# Patient Record
Sex: Female | Born: 1960 | Hispanic: No | Smoking: Heavy tobacco smoker
Health system: Southern US, Community
[De-identification: ages and names within clinical notes are randomized; demographics above are authoritative.]

## PROBLEM LIST (undated history)

## (undated) DIAGNOSIS — Z972 Presence of dental prosthetic device (complete) (partial): Secondary | ICD-10-CM

## (undated) DIAGNOSIS — J449 Chronic obstructive pulmonary disease, unspecified: Secondary | ICD-10-CM

## (undated) DIAGNOSIS — F329 Major depressive disorder, single episode, unspecified: Secondary | ICD-10-CM

## (undated) DIAGNOSIS — F32A Depression, unspecified: Secondary | ICD-10-CM

## (undated) DIAGNOSIS — H9190 Unspecified hearing loss, unspecified ear: Secondary | ICD-10-CM

## (undated) DIAGNOSIS — D649 Anemia, unspecified: Secondary | ICD-10-CM

## (undated) DIAGNOSIS — E039 Hypothyroidism, unspecified: Secondary | ICD-10-CM

## (undated) DIAGNOSIS — E079 Disorder of thyroid, unspecified: Secondary | ICD-10-CM

## (undated) HISTORY — PX: TONSILLECTOMY: SUR1361

## (undated) HISTORY — PX: TUBAL LIGATION: SHX77

---

## 1998-01-05 ENCOUNTER — Emergency Department (HOSPITAL_COMMUNITY): Admission: EM | Admit: 1998-01-05 | Discharge: 1998-01-05 | Payer: Self-pay | Admitting: Emergency Medicine

## 1998-05-10 ENCOUNTER — Emergency Department (HOSPITAL_COMMUNITY): Admission: EM | Admit: 1998-05-10 | Discharge: 1998-05-10 | Payer: Self-pay | Admitting: Emergency Medicine

## 1998-06-01 ENCOUNTER — Emergency Department (HOSPITAL_COMMUNITY): Admission: EM | Admit: 1998-06-01 | Discharge: 1998-06-01 | Payer: Self-pay | Admitting: Emergency Medicine

## 1998-06-19 ENCOUNTER — Emergency Department (HOSPITAL_COMMUNITY): Admission: EM | Admit: 1998-06-19 | Discharge: 1998-06-19 | Payer: Self-pay

## 1998-06-22 ENCOUNTER — Encounter: Admission: RE | Admit: 1998-06-22 | Discharge: 1998-06-22 | Payer: Self-pay | Admitting: Hematology and Oncology

## 1998-11-01 ENCOUNTER — Encounter: Admission: RE | Admit: 1998-11-01 | Discharge: 1998-11-01 | Payer: Self-pay | Admitting: Internal Medicine

## 1998-12-13 ENCOUNTER — Encounter: Admission: RE | Admit: 1998-12-13 | Discharge: 1998-12-13 | Payer: Self-pay | Admitting: Internal Medicine

## 1999-01-06 ENCOUNTER — Encounter: Admission: RE | Admit: 1999-01-06 | Discharge: 1999-01-06 | Payer: Self-pay | Admitting: Internal Medicine

## 1999-02-20 ENCOUNTER — Encounter: Admission: RE | Admit: 1999-02-20 | Discharge: 1999-02-20 | Payer: Self-pay | Admitting: Internal Medicine

## 1999-03-17 ENCOUNTER — Encounter: Admission: RE | Admit: 1999-03-17 | Discharge: 1999-03-17 | Payer: Self-pay | Admitting: Internal Medicine

## 1999-03-26 ENCOUNTER — Emergency Department (HOSPITAL_COMMUNITY): Admission: EM | Admit: 1999-03-26 | Discharge: 1999-03-26 | Payer: Self-pay | Admitting: Emergency Medicine

## 1999-06-11 ENCOUNTER — Emergency Department (HOSPITAL_COMMUNITY): Admission: EM | Admit: 1999-06-11 | Discharge: 1999-06-11 | Payer: Self-pay | Admitting: Emergency Medicine

## 1999-06-13 ENCOUNTER — Encounter: Admission: RE | Admit: 1999-06-13 | Discharge: 1999-06-13 | Payer: Self-pay | Admitting: Hematology and Oncology

## 1999-07-23 ENCOUNTER — Emergency Department (HOSPITAL_COMMUNITY): Admission: EM | Admit: 1999-07-23 | Discharge: 1999-07-23 | Payer: Self-pay | Admitting: Emergency Medicine

## 1999-09-15 ENCOUNTER — Encounter: Admission: RE | Admit: 1999-09-15 | Discharge: 1999-09-15 | Payer: Self-pay | Admitting: Internal Medicine

## 1999-12-20 ENCOUNTER — Emergency Department (HOSPITAL_COMMUNITY): Admission: EM | Admit: 1999-12-20 | Discharge: 1999-12-20 | Payer: Self-pay | Admitting: Emergency Medicine

## 2000-02-27 ENCOUNTER — Emergency Department (HOSPITAL_COMMUNITY): Admission: EM | Admit: 2000-02-27 | Discharge: 2000-02-27 | Payer: Self-pay | Admitting: Emergency Medicine

## 2000-04-02 ENCOUNTER — Encounter: Admission: RE | Admit: 2000-04-02 | Discharge: 2000-04-02 | Payer: Self-pay | Admitting: Internal Medicine

## 2000-04-04 ENCOUNTER — Encounter: Admission: RE | Admit: 2000-04-04 | Discharge: 2000-04-04 | Payer: Self-pay | Admitting: Internal Medicine

## 2000-05-22 ENCOUNTER — Emergency Department (HOSPITAL_COMMUNITY): Admission: EM | Admit: 2000-05-22 | Discharge: 2000-05-22 | Payer: Self-pay | Admitting: Emergency Medicine

## 2000-05-28 ENCOUNTER — Encounter: Admission: RE | Admit: 2000-05-28 | Discharge: 2000-05-28 | Payer: Self-pay | Admitting: Internal Medicine

## 2000-08-02 ENCOUNTER — Encounter: Payer: Self-pay | Admitting: Emergency Medicine

## 2000-08-02 ENCOUNTER — Emergency Department (HOSPITAL_COMMUNITY): Admission: EM | Admit: 2000-08-02 | Discharge: 2000-08-02 | Payer: Self-pay | Admitting: Emergency Medicine

## 2000-08-14 ENCOUNTER — Emergency Department (HOSPITAL_COMMUNITY): Admission: EM | Admit: 2000-08-14 | Discharge: 2000-08-14 | Payer: Self-pay | Admitting: Emergency Medicine

## 2000-10-05 ENCOUNTER — Emergency Department (HOSPITAL_COMMUNITY): Admission: EM | Admit: 2000-10-05 | Discharge: 2000-10-05 | Payer: Self-pay | Admitting: Emergency Medicine

## 2001-01-05 ENCOUNTER — Emergency Department (HOSPITAL_COMMUNITY): Admission: EM | Admit: 2001-01-05 | Discharge: 2001-01-05 | Payer: Self-pay | Admitting: Emergency Medicine

## 2001-04-03 ENCOUNTER — Emergency Department (HOSPITAL_COMMUNITY): Admission: EM | Admit: 2001-04-03 | Discharge: 2001-04-03 | Payer: Self-pay | Admitting: Emergency Medicine

## 2001-04-03 ENCOUNTER — Encounter: Payer: Self-pay | Admitting: Emergency Medicine

## 2001-06-23 ENCOUNTER — Emergency Department (HOSPITAL_COMMUNITY): Admission: EM | Admit: 2001-06-23 | Discharge: 2001-06-23 | Payer: Self-pay | Admitting: Emergency Medicine

## 2001-06-23 ENCOUNTER — Encounter: Payer: Self-pay | Admitting: Emergency Medicine

## 2001-12-23 ENCOUNTER — Emergency Department (HOSPITAL_COMMUNITY): Admission: EM | Admit: 2001-12-23 | Discharge: 2001-12-23 | Payer: Self-pay | Admitting: Emergency Medicine

## 2001-12-23 ENCOUNTER — Encounter: Payer: Self-pay | Admitting: Emergency Medicine

## 2002-01-12 ENCOUNTER — Encounter: Admission: RE | Admit: 2002-01-12 | Discharge: 2002-01-12 | Payer: Self-pay | Admitting: Internal Medicine

## 2002-02-24 ENCOUNTER — Emergency Department (HOSPITAL_COMMUNITY): Admission: EM | Admit: 2002-02-24 | Discharge: 2002-02-24 | Payer: Self-pay | Admitting: Emergency Medicine

## 2002-05-17 ENCOUNTER — Emergency Department (HOSPITAL_COMMUNITY): Admission: EM | Admit: 2002-05-17 | Discharge: 2002-05-17 | Payer: Self-pay | Admitting: Emergency Medicine

## 2002-06-10 ENCOUNTER — Emergency Department (HOSPITAL_COMMUNITY): Admission: EM | Admit: 2002-06-10 | Discharge: 2002-06-10 | Payer: Self-pay | Admitting: *Deleted

## 2002-07-01 ENCOUNTER — Encounter: Admission: RE | Admit: 2002-07-01 | Discharge: 2002-07-01 | Payer: Self-pay | Admitting: Internal Medicine

## 2002-07-03 ENCOUNTER — Encounter: Admission: RE | Admit: 2002-07-03 | Discharge: 2002-07-03 | Payer: Self-pay | Admitting: Internal Medicine

## 2002-07-27 ENCOUNTER — Emergency Department (HOSPITAL_COMMUNITY): Admission: EM | Admit: 2002-07-27 | Discharge: 2002-07-27 | Payer: Self-pay | Admitting: Emergency Medicine

## 2002-08-03 ENCOUNTER — Emergency Department (HOSPITAL_COMMUNITY): Admission: EM | Admit: 2002-08-03 | Discharge: 2002-08-03 | Payer: Self-pay | Admitting: Emergency Medicine

## 2002-08-03 ENCOUNTER — Encounter: Payer: Self-pay | Admitting: Emergency Medicine

## 2002-12-17 ENCOUNTER — Emergency Department (HOSPITAL_COMMUNITY): Admission: EM | Admit: 2002-12-17 | Discharge: 2002-12-17 | Payer: Self-pay | Admitting: Emergency Medicine

## 2002-12-17 ENCOUNTER — Encounter: Payer: Self-pay | Admitting: Emergency Medicine

## 2003-02-02 ENCOUNTER — Encounter: Admission: RE | Admit: 2003-02-02 | Discharge: 2003-02-02 | Payer: Self-pay | Admitting: Internal Medicine

## 2003-02-17 ENCOUNTER — Encounter: Admission: RE | Admit: 2003-02-17 | Discharge: 2003-02-17 | Payer: Self-pay | Admitting: Internal Medicine

## 2004-02-08 ENCOUNTER — Encounter: Admission: RE | Admit: 2004-02-08 | Discharge: 2004-02-08 | Payer: Self-pay | Admitting: Internal Medicine

## 2004-03-24 ENCOUNTER — Emergency Department (HOSPITAL_COMMUNITY): Admission: EM | Admit: 2004-03-24 | Discharge: 2004-03-24 | Payer: Self-pay | Admitting: Emergency Medicine

## 2004-03-30 ENCOUNTER — Encounter: Admission: RE | Admit: 2004-03-30 | Discharge: 2004-03-30 | Payer: Self-pay | Admitting: Internal Medicine

## 2004-06-25 ENCOUNTER — Emergency Department (HOSPITAL_COMMUNITY): Admission: EM | Admit: 2004-06-25 | Discharge: 2004-06-25 | Payer: Self-pay | Admitting: Emergency Medicine

## 2005-01-31 ENCOUNTER — Emergency Department (HOSPITAL_COMMUNITY): Admission: EM | Admit: 2005-01-31 | Discharge: 2005-01-31 | Payer: Self-pay | Admitting: Emergency Medicine

## 2008-03-01 ENCOUNTER — Emergency Department (HOSPITAL_COMMUNITY): Admission: EM | Admit: 2008-03-01 | Discharge: 2008-03-01 | Payer: Self-pay | Admitting: Emergency Medicine

## 2008-08-20 HISTORY — PX: HIP SURGERY: SHX245

## 2008-08-20 HISTORY — PX: ORIF PATELLA: SHX5033

## 2008-11-16 ENCOUNTER — Emergency Department (HOSPITAL_COMMUNITY): Admission: EM | Admit: 2008-11-16 | Discharge: 2008-11-17 | Payer: Self-pay | Admitting: Emergency Medicine

## 2008-11-17 ENCOUNTER — Inpatient Hospital Stay (HOSPITAL_COMMUNITY): Admission: EM | Admit: 2008-11-17 | Discharge: 2008-11-23 | Payer: Self-pay | Admitting: Emergency Medicine

## 2008-11-18 ENCOUNTER — Encounter (INDEPENDENT_AMBULATORY_CARE_PROVIDER_SITE_OTHER): Payer: Self-pay | Admitting: Internal Medicine

## 2008-11-22 ENCOUNTER — Encounter (INDEPENDENT_AMBULATORY_CARE_PROVIDER_SITE_OTHER): Payer: Self-pay | Admitting: Cardiology

## 2008-12-13 ENCOUNTER — Inpatient Hospital Stay (HOSPITAL_COMMUNITY): Admission: EM | Admit: 2008-12-13 | Discharge: 2008-12-22 | Payer: Self-pay | Admitting: Emergency Medicine

## 2008-12-14 ENCOUNTER — Encounter (INDEPENDENT_AMBULATORY_CARE_PROVIDER_SITE_OTHER): Payer: Self-pay | Admitting: General Surgery

## 2008-12-15 ENCOUNTER — Ambulatory Visit: Payer: Self-pay | Admitting: Physical Medicine & Rehabilitation

## 2008-12-25 ENCOUNTER — Emergency Department (HOSPITAL_COMMUNITY): Admission: EM | Admit: 2008-12-25 | Discharge: 2008-12-25 | Payer: Self-pay | Admitting: Emergency Medicine

## 2009-03-10 ENCOUNTER — Emergency Department (HOSPITAL_COMMUNITY): Admission: EM | Admit: 2009-03-10 | Discharge: 2009-03-11 | Payer: Self-pay | Admitting: Emergency Medicine

## 2009-03-11 ENCOUNTER — Inpatient Hospital Stay (HOSPITAL_COMMUNITY): Admission: AD | Admit: 2009-03-11 | Discharge: 2009-03-12 | Payer: Self-pay | Admitting: Psychiatry

## 2009-03-11 ENCOUNTER — Ambulatory Visit: Payer: Self-pay | Admitting: Psychiatry

## 2009-07-28 ENCOUNTER — Emergency Department (HOSPITAL_COMMUNITY): Admission: EM | Admit: 2009-07-28 | Discharge: 2009-07-29 | Payer: Self-pay | Admitting: Emergency Medicine

## 2009-07-29 ENCOUNTER — Ambulatory Visit: Payer: Self-pay | Admitting: Psychiatry

## 2009-07-29 ENCOUNTER — Inpatient Hospital Stay (HOSPITAL_COMMUNITY): Admission: EM | Admit: 2009-07-29 | Discharge: 2009-08-01 | Payer: Self-pay | Admitting: Psychiatry

## 2009-08-13 ENCOUNTER — Inpatient Hospital Stay (HOSPITAL_COMMUNITY): Admission: RE | Admit: 2009-08-13 | Discharge: 2009-08-17 | Payer: Self-pay | Admitting: Psychiatry

## 2009-08-13 ENCOUNTER — Ambulatory Visit: Payer: Self-pay | Admitting: Psychiatry

## 2009-08-13 ENCOUNTER — Emergency Department (HOSPITAL_COMMUNITY): Admission: EM | Admit: 2009-08-13 | Discharge: 2009-08-13 | Payer: Self-pay | Admitting: Emergency Medicine

## 2009-08-20 ENCOUNTER — Emergency Department (HOSPITAL_COMMUNITY): Admission: EM | Admit: 2009-08-20 | Discharge: 2009-08-21 | Payer: Self-pay | Admitting: Emergency Medicine

## 2009-08-21 ENCOUNTER — Inpatient Hospital Stay (HOSPITAL_COMMUNITY): Admission: RE | Admit: 2009-08-21 | Discharge: 2009-08-23 | Payer: Self-pay | Admitting: Psychiatry

## 2010-04-01 ENCOUNTER — Emergency Department (HOSPITAL_COMMUNITY): Admission: EM | Admit: 2010-04-01 | Discharge: 2010-04-01 | Payer: Self-pay | Admitting: Emergency Medicine

## 2010-04-10 ENCOUNTER — Inpatient Hospital Stay (HOSPITAL_COMMUNITY): Admission: RE | Admit: 2010-04-10 | Discharge: 2010-04-12 | Payer: Self-pay | Admitting: Orthopedic Surgery

## 2010-05-16 ENCOUNTER — Encounter (INDEPENDENT_AMBULATORY_CARE_PROVIDER_SITE_OTHER): Payer: Self-pay | Admitting: Orthopedic Surgery

## 2010-05-16 ENCOUNTER — Ambulatory Visit: Payer: Self-pay | Admitting: Vascular Surgery

## 2010-05-16 ENCOUNTER — Ambulatory Visit: Admission: RE | Admit: 2010-05-16 | Discharge: 2010-05-16 | Payer: Self-pay | Admitting: Orthopedic Surgery

## 2010-11-02 LAB — SURGICAL PCR SCREEN
MRSA, PCR: NEGATIVE
Staphylococcus aureus: POSITIVE — AB

## 2010-11-02 LAB — CBC
HCT: 25 % — ABNORMAL LOW (ref 36.0–46.0)
MCH: 29.5 pg (ref 26.0–34.0)
MCHC: 33.2 g/dL (ref 30.0–36.0)
MCV: 85.8 fL (ref 78.0–100.0)
MCV: 86 fL (ref 78.0–100.0)
Platelets: 301 10*3/uL (ref 150–400)
RBC: 4.85 MIL/uL (ref 3.87–5.11)
RDW: 14.8 % (ref 11.5–15.5)
RDW: 15 % (ref 11.5–15.5)
RDW: 15 % (ref 11.5–15.5)
WBC: 13.2 10*3/uL — ABNORMAL HIGH (ref 4.0–10.5)
WBC: 6 10*3/uL (ref 4.0–10.5)
WBC: 8.4 10*3/uL (ref 4.0–10.5)

## 2010-11-02 LAB — TYPE AND SCREEN
ABO/RH(D): A POS
Antibody Screen: NEGATIVE

## 2010-11-02 LAB — BASIC METABOLIC PANEL
BUN: 12 mg/dL (ref 6–23)
BUN: 18 mg/dL (ref 6–23)
Calcium: 8.4 mg/dL (ref 8.4–10.5)
Calcium: 8.7 mg/dL (ref 8.4–10.5)
Calcium: 9.6 mg/dL (ref 8.4–10.5)
Chloride: 107 mEq/L (ref 96–112)
Creatinine, Ser: 0.9 mg/dL (ref 0.4–1.2)
GFR calc Af Amer: >60 mL/min (ref >60)
GFR calc Af Amer: >60 mL/min (ref >60)
GFR calc non Af Amer: >60 mL/min (ref >60)
GFR calc non Af Amer: >60 mL/min (ref >60)
GFR calc non Af Amer: >60 mL/min (ref >60)
Glucose, Bld: 86 mg/dL (ref 70–99)
Potassium: 3.6 mEq/L (ref 3.5–5.1)
Sodium: 141 mEq/L (ref 135–145)
Sodium: 142 mEq/L (ref 135–145)

## 2010-11-02 LAB — URINALYSIS, ROUTINE W REFLEX MICROSCOPIC
Glucose, UA: NEGATIVE mg/dL
Hgb urine dipstick: NEGATIVE
Ketones, ur: NEGATIVE mg/dL
pH: 6 (ref 5.0–8.0)

## 2010-11-02 LAB — URINE MICROSCOPIC-ADD ON

## 2010-11-02 LAB — DIFFERENTIAL
Basophils Absolute: 0.1 10*3/uL (ref 0.0–0.1)
Eosinophils Relative: 9 % — ABNORMAL HIGH (ref 0–5)
Lymphocytes Relative: 32 % (ref 12–46)
Lymphs Abs: 1.9 10*3/uL (ref 0.7–4.0)
Neutro Abs: 3.1 10*3/uL (ref 1.7–7.7)
Neutrophils Relative %: 52 % (ref 43–77)

## 2010-11-02 LAB — PROTIME-INR
INR: 0.92 (ref 0.00–1.49)
Prothrombin Time: 12.6 seconds (ref 11.6–15.2)

## 2010-11-02 LAB — PREGNANCY, URINE: Preg Test, Ur: NEGATIVE

## 2010-11-04 LAB — RAPID URINE DRUG SCREEN, HOSP PERFORMED
Amphetamines: NOT DETECTED
Barbiturates: NOT DETECTED
Benzodiazepines: NOT DETECTED
Cocaine: POSITIVE — AB
Opiates: POSITIVE — AB

## 2010-11-04 LAB — CBC
Hemoglobin: 12.3 g/dL (ref 12.0–15.0)
MCHC: 33.3 g/dL (ref 30.0–36.0)
MCV: 91.1 fL (ref 78.0–100.0)
RBC: 4.05 MIL/uL (ref 3.87–5.11)
RDW: 14.4 % (ref 11.5–15.5)

## 2010-11-04 LAB — COMPREHENSIVE METABOLIC PANEL
CO2: 27 mEq/L (ref 19–32)
Calcium: 9.5 mg/dL (ref 8.4–10.5)
Creatinine, Ser: 0.86 mg/dL (ref 0.4–1.2)
GFR calc Af Amer: >60 mL/min (ref >60)
GFR calc non Af Amer: >60 mL/min (ref >60)
Glucose, Bld: 101 mg/dL — ABNORMAL HIGH (ref 70–99)
Total Protein: 7 g/dL (ref 6.0–8.3)

## 2010-11-04 LAB — DIFFERENTIAL
Lymphocytes Relative: 20 % (ref 12–46)
Lymphs Abs: 1.5 10*3/uL (ref 0.7–4.0)
Neutrophils Relative %: 68 % (ref 43–77)

## 2010-11-04 LAB — URINALYSIS, ROUTINE W REFLEX MICROSCOPIC
Bilirubin Urine: NEGATIVE
Glucose, UA: NEGATIVE mg/dL
Hgb urine dipstick: NEGATIVE
Protein, ur: NEGATIVE mg/dL

## 2010-11-20 LAB — DIFFERENTIAL
Eosinophils Relative: 11 % — ABNORMAL HIGH (ref 0–5)
Lymphocytes Relative: 32 % (ref 12–46)
Monocytes Absolute: 0.3 10*3/uL (ref 0.1–1.0)
Monocytes Relative: 6 % (ref 3–12)
Neutro Abs: 2.7 10*3/uL (ref 1.7–7.7)

## 2010-11-20 LAB — CBC
HCT: 35.3 % — ABNORMAL LOW (ref 36.0–46.0)
Hemoglobin: 11.7 g/dL — ABNORMAL LOW (ref 12.0–15.0)
RBC: 3.9 MIL/uL (ref 3.87–5.11)
RDW: 14.2 % (ref 11.5–15.5)

## 2010-11-20 LAB — ETHANOL: Alcohol, Ethyl (B): <5 mg/dL (ref 0–10)

## 2010-11-20 LAB — BASIC METABOLIC PANEL
BUN: 14 mg/dL (ref 6–23)
Calcium: 9.1 mg/dL (ref 8.4–10.5)
Chloride: 103 mEq/L (ref 96–112)
GFR calc non Af Amer: >60 mL/min (ref >60)

## 2010-11-20 LAB — RAPID URINE DRUG SCREEN, HOSP PERFORMED
Amphetamines: NOT DETECTED
Barbiturates: NOT DETECTED
Benzodiazepines: NOT DETECTED

## 2010-11-21 LAB — DIFFERENTIAL
Basophils Relative: 1 % (ref 0–1)
Eosinophils Absolute: 0.4 10*3/uL (ref 0.0–0.7)
Eosinophils Relative: 6 % — ABNORMAL HIGH (ref 0–5)
Lymphs Abs: 1.7 10*3/uL (ref 0.7–4.0)
Monocytes Absolute: 0.4 10*3/uL (ref 0.1–1.0)
Monocytes Relative: 6 % (ref 3–12)
Neutrophils Relative %: 60 % (ref 43–77)

## 2010-11-21 LAB — CBC
HCT: 35.8 % — ABNORMAL LOW (ref 36.0–46.0)
Hemoglobin: 11.6 g/dL — ABNORMAL LOW (ref 12.0–15.0)
MCHC: 32.5 g/dL (ref 30.0–36.0)
MCV: 92.2 fL (ref 78.0–100.0)
RBC: 3.89 MIL/uL (ref 3.87–5.11)
WBC: 6.6 10*3/uL (ref 4.0–10.5)

## 2010-11-21 LAB — ETHANOL: Alcohol, Ethyl (B): <5 mg/dL (ref 0–10)

## 2010-11-21 LAB — BASIC METABOLIC PANEL
CO2: 31 mEq/L (ref 19–32)
Chloride: 102 mEq/L (ref 96–112)
GFR calc Af Amer: >60 mL/min (ref >60)
Potassium: 3.3 mEq/L — ABNORMAL LOW (ref 3.5–5.1)
Sodium: 140 mEq/L (ref 135–145)

## 2010-11-21 LAB — URINALYSIS, ROUTINE W REFLEX MICROSCOPIC
Hgb urine dipstick: NEGATIVE
Nitrite: NEGATIVE
Protein, ur: NEGATIVE mg/dL
Urobilinogen, UA: 1 mg/dL (ref 0.0–1.0)

## 2010-11-21 LAB — RAPID URINE DRUG SCREEN, HOSP PERFORMED
Amphetamines: NOT DETECTED
Barbiturates: NOT DETECTED
Benzodiazepines: POSITIVE — AB
Cocaine: POSITIVE — AB
Opiates: POSITIVE — AB
Tetrahydrocannabinol: NOT DETECTED

## 2010-11-21 LAB — ACETAMINOPHEN LEVEL: Acetaminophen (Tylenol), Serum: <10 ug/mL — ABNORMAL LOW (ref 10–30)

## 2010-11-26 LAB — RAPID URINE DRUG SCREEN, HOSP PERFORMED
Amphetamines: NOT DETECTED
Benzodiazepines: NOT DETECTED

## 2010-11-26 LAB — T4, FREE: Free T4: <0.1 ng/dL — ABNORMAL LOW (ref 0.80–1.80)

## 2010-11-26 LAB — DIFFERENTIAL
Basophils Absolute: 0.1 10*3/uL (ref 0.0–0.1)
Eosinophils Absolute: 0.6 10*3/uL (ref 0.0–0.7)
Eosinophils Relative: 7 % — ABNORMAL HIGH (ref 0–5)
Lymphocytes Relative: 29 % (ref 12–46)
Neutrophils Relative %: 58 % (ref 43–77)

## 2010-11-26 LAB — TSH
TSH: 648.333 u[IU]/mL — ABNORMAL HIGH (ref 0.350–4.500)
TSH: 583.662 u[IU]/mL — ABNORMAL HIGH (ref 0.350–4.500)

## 2010-11-26 LAB — CBC
HCT: 37.6 % (ref 36.0–46.0)
Platelets: 333 10*3/uL (ref 150–400)
RDW: 15.6 % — ABNORMAL HIGH (ref 11.5–15.5)
WBC: 7.9 10*3/uL (ref 4.0–10.5)

## 2010-11-26 LAB — POCT I-STAT, CHEM 8
Calcium, Ion: 1.15 mmol/L (ref 1.12–1.32)
Glucose, Bld: 105 mg/dL — ABNORMAL HIGH (ref 70–99)
HCT: 39 % (ref 36.0–46.0)
Hemoglobin: 13.3 g/dL (ref 12.0–15.0)
Potassium: 3.4 mEq/L — ABNORMAL LOW (ref 3.5–5.1)

## 2010-11-26 LAB — ETHANOL: Alcohol, Ethyl (B): <5 mg/dL (ref 0–10)

## 2010-11-28 LAB — CBC
Hemoglobin: 10.3 g/dL — ABNORMAL LOW (ref 12.0–15.0)
MCHC: 34.8 g/dL (ref 30.0–36.0)
RBC: 3.34 MIL/uL — ABNORMAL LOW (ref 3.87–5.11)
RDW: 14.7 % (ref 11.5–15.5)
RDW: 15.2 % (ref 11.5–15.5)
WBC: 5.9 10*3/uL (ref 4.0–10.5)

## 2010-11-28 LAB — BASIC METABOLIC PANEL
Calcium: 8.8 mg/dL (ref 8.4–10.5)
GFR calc Af Amer: >60 mL/min (ref >60)
GFR calc non Af Amer: >60 mL/min (ref >60)
Sodium: 138 mEq/L (ref 135–145)

## 2010-11-29 LAB — CROSSMATCH

## 2010-11-29 LAB — RETICULOCYTES
RBC.: 3.38 MIL/uL — ABNORMAL LOW (ref 3.87–5.11)
Retic Count, Absolute: 33.8 10*3/uL (ref 19.0–186.0)
Retic Ct Pct: 1 % (ref 0.4–3.1)

## 2010-11-29 LAB — HEPATITIS PANEL, ACUTE
HCV Ab: NEGATIVE
Hep A IgM: NEGATIVE
Hep B C IgM: NEGATIVE
Hep B C IgM: NEGATIVE
Hepatitis B Surface Ag: NEGATIVE

## 2010-11-29 LAB — COMPREHENSIVE METABOLIC PANEL
ALT: 59 U/L — ABNORMAL HIGH (ref 0–35)
AST: 95 U/L — ABNORMAL HIGH (ref 0–37)
BUN: 17 mg/dL (ref 6–23)
CO2: 29 mEq/L (ref 19–32)
CO2: 30 mEq/L (ref 19–32)
Calcium: 8.4 mg/dL (ref 8.4–10.5)
Calcium: 8.7 mg/dL (ref 8.4–10.5)
Chloride: 103 mEq/L (ref 96–112)
Creatinine, Ser: 1.11 mg/dL (ref 0.4–1.2)
Creatinine, Ser: 1.09 mg/dL (ref 0.4–1.2)
GFR calc Af Amer: >60 mL/min (ref >60)
GFR calc non Af Amer: 54 mL/min — ABNORMAL LOW (ref >60)
GFR calc non Af Amer: >60 mL/min (ref >60)
Glucose, Bld: 138 mg/dL — ABNORMAL HIGH (ref 70–99)
Glucose, Bld: 92 mg/dL (ref 70–99)
Potassium: 4 mEq/L (ref 3.5–5.1)
Sodium: 138 mEq/L (ref 135–145)
Total Bilirubin: 0.6 mg/dL (ref 0.3–1.2)
Total Protein: 6.2 g/dL (ref 6.0–8.3)

## 2010-11-29 LAB — URINALYSIS, ROUTINE W REFLEX MICROSCOPIC
Bilirubin Urine: NEGATIVE
Glucose, UA: NEGATIVE mg/dL
Hgb urine dipstick: NEGATIVE
Nitrite: NEGATIVE
Specific Gravity, Urine: >1.03 — ABNORMAL HIGH (ref 1.005–1.030)
Specific Gravity, Urine: 1.009 (ref 1.005–1.030)
Urobilinogen, UA: 0.2 mg/dL (ref 0.0–1.0)
pH: 6 (ref 5.0–8.0)
pH: 7 (ref 5.0–8.0)

## 2010-11-29 LAB — CBC
HCT: 19.9 % — ABNORMAL LOW (ref 36.0–46.0)
HCT: 21.5 % — ABNORMAL LOW (ref 36.0–46.0)
HCT: 24.7 % — ABNORMAL LOW (ref 36.0–46.0)
HCT: 29.4 % — ABNORMAL LOW (ref 36.0–46.0)
HCT: 30.5 % — ABNORMAL LOW (ref 36.0–46.0)
HCT: 31.7 % — ABNORMAL LOW (ref 36.0–46.0)
HCT: 33.1 % — ABNORMAL LOW (ref 36.0–46.0)
Hemoglobin: 10.1 g/dL — ABNORMAL LOW (ref 12.0–15.0)
Hemoglobin: 10.1 g/dL — ABNORMAL LOW (ref 12.0–15.0)
Hemoglobin: 7 g/dL — CL (ref 12.0–15.0)
Hemoglobin: 8.7 g/dL — ABNORMAL LOW (ref 12.0–15.0)
Hemoglobin: 9.8 g/dL — ABNORMAL LOW (ref 12.0–15.0)
Hemoglobin: 10.2 g/dL — ABNORMAL LOW (ref 12.0–15.0)
Hemoglobin: 11.1 g/dL — ABNORMAL LOW (ref 12.0–15.0)
Hemoglobin: 9.8 g/dL — ABNORMAL LOW (ref 12.0–15.0)
MCHC: 34.2 g/dL (ref 30.0–36.0)
MCHC: 35 g/dL (ref 30.0–36.0)
MCHC: 35 g/dL (ref 30.0–36.0)
MCHC: 33.5 g/dL (ref 30.0–36.0)
MCHC: 34.1 g/dL (ref 30.0–36.0)
MCV: 92.5 fL (ref 78.0–100.0)
MCV: 93.5 fL (ref 78.0–100.0)
MCV: 94.2 fL (ref 78.0–100.0)
Platelets: 136 10*3/uL — ABNORMAL LOW (ref 150–400)
Platelets: 276 10*3/uL (ref 150–400)
RBC: 3.16 MIL/uL — ABNORMAL LOW (ref 3.87–5.11)
RBC: 3.18 MIL/uL — ABNORMAL LOW (ref 3.87–5.11)
RBC: 3 MIL/uL — ABNORMAL LOW (ref 3.87–5.11)
RBC: 3.11 MIL/uL — ABNORMAL LOW (ref 3.87–5.11)
RBC: 3.11 MIL/uL — ABNORMAL LOW (ref 3.87–5.11)
RBC: 3.28 MIL/uL — ABNORMAL LOW (ref 3.87–5.11)
RBC: 3.49 MIL/uL — ABNORMAL LOW (ref 3.87–5.11)
RDW: 13.7 % (ref 11.5–15.5)
RDW: 14 % (ref 11.5–15.5)
RDW: 14 % (ref 11.5–15.5)
RDW: 14.1 % (ref 11.5–15.5)
RDW: 15.3 % (ref 11.5–15.5)
RDW: 15.3 % (ref 11.5–15.5)
RDW: 15.4 % (ref 11.5–15.5)
WBC: 6.7 10*3/uL (ref 4.0–10.5)
WBC: 7.1 10*3/uL (ref 4.0–10.5)
WBC: 4.1 10*3/uL (ref 4.0–10.5)
WBC: 4.1 10*3/uL (ref 4.0–10.5)

## 2010-11-29 LAB — BASIC METABOLIC PANEL
BUN: 8 mg/dL (ref 6–23)
BUN: 9 mg/dL (ref 6–23)
CO2: 26 mEq/L (ref 19–32)
Calcium: 9 mg/dL (ref 8.4–10.5)
Chloride: 101 mEq/L (ref 96–112)
Chloride: 103 mEq/L (ref 96–112)
Creatinine, Ser: 0.92 mg/dL (ref 0.4–1.2)
GFR calc Af Amer: >60 mL/min (ref >60)
GFR calc Af Amer: >60 mL/min (ref >60)
GFR calc Af Amer: >60 mL/min (ref >60)
GFR calc Af Amer: >60 mL/min (ref >60)
GFR calc Af Amer: >60 mL/min (ref >60)
GFR calc non Af Amer: >60 mL/min (ref >60)
GFR calc non Af Amer: >60 mL/min (ref >60)
GFR calc non Af Amer: >60 mL/min (ref >60)
GFR calc non Af Amer: >60 mL/min (ref >60)
Glucose, Bld: 98 mg/dL (ref 70–99)
Glucose, Bld: 118 mg/dL — ABNORMAL HIGH (ref 70–99)
Glucose, Bld: 88 mg/dL (ref 70–99)
Glucose, Bld: 91 mg/dL (ref 70–99)
Potassium: 3.5 mEq/L (ref 3.5–5.1)
Potassium: 3.5 mEq/L (ref 3.5–5.1)
Potassium: 4.5 mEq/L (ref 3.5–5.1)
Potassium: 4.8 mEq/L (ref 3.5–5.1)
Sodium: 136 mEq/L (ref 135–145)
Sodium: 139 mEq/L (ref 135–145)
Sodium: 138 mEq/L (ref 135–145)
Sodium: 139 mEq/L (ref 135–145)

## 2010-11-29 LAB — RAPID URINE DRUG SCREEN, HOSP PERFORMED
Amphetamines: NOT DETECTED
Amphetamines: NOT DETECTED
Cocaine: NOT DETECTED
Opiates: POSITIVE — AB
Opiates: POSITIVE — AB
Tetrahydrocannabinol: NOT DETECTED
Tetrahydrocannabinol: POSITIVE — AB

## 2010-11-29 LAB — GLUCOSE, CAPILLARY

## 2010-11-29 LAB — DIFFERENTIAL
Basophils Absolute: 0 10*3/uL (ref 0.0–0.1)
Basophils Relative: 1 % (ref 0–1)
Lymphocytes Relative: 12 % (ref 12–46)
Lymphs Abs: 2.5 10*3/uL (ref 0.7–4.0)
Monocytes Absolute: 0.6 10*3/uL (ref 0.1–1.0)
Monocytes Relative: 6 % (ref 3–12)
Neutro Abs: 4.7 10*3/uL (ref 1.7–7.7)
Neutro Abs: 5 10*3/uL (ref 1.7–7.7)
Neutrophils Relative %: 55 % (ref 43–77)

## 2010-11-29 LAB — FERRITIN: Ferritin: 16 ng/mL (ref 10–291)

## 2010-11-29 LAB — IRON AND TIBC
Saturation Ratios: 17 % — ABNORMAL LOW (ref 20–55)
Saturation Ratios: 22 % (ref 20–55)
TIBC: 330 ug/dL (ref 250–470)

## 2010-11-29 LAB — URINE MICROSCOPIC-ADD ON

## 2010-11-29 LAB — PROTIME-INR: INR: 1 (ref 0.00–1.49)

## 2010-11-29 LAB — SALICYLATE LEVEL: Salicylate Lvl: <4 mg/dL (ref 2.8–20.0)

## 2010-11-29 LAB — CORTISOL: Cortisol, Plasma: 6.8 ug/dL

## 2010-11-29 LAB — CARDIAC PANEL(CRET KIN+CKTOT+MB+TROPI)
CK, MB: 19.7 ng/mL — ABNORMAL HIGH (ref 0.3–4.0)
Relative Index: 2.5 (ref 0.0–2.5)

## 2010-11-29 LAB — CANCER ANTIGEN 19-9: CA 19-9: 19.8 U/mL — ABNORMAL LOW (ref <35.0)

## 2010-11-29 LAB — HIV ANTIBODY (ROUTINE TESTING W REFLEX): HIV: NONREACTIVE

## 2010-11-29 LAB — TSH: TSH: 413.947 u[IU]/mL — ABNORMAL HIGH (ref 0.350–4.500)

## 2010-11-29 LAB — ABO/RH: ABO/RH(D): A POS

## 2010-11-29 LAB — FOLATE: Folate: 5.4 ng/mL

## 2010-11-29 LAB — APTT: aPTT: 29 seconds (ref 24–37)

## 2010-11-30 LAB — URINALYSIS, ROUTINE W REFLEX MICROSCOPIC
Bilirubin Urine: NEGATIVE
Nitrite: POSITIVE — AB
Protein, ur: NEGATIVE mg/dL
Specific Gravity, Urine: 1.027 (ref 1.005–1.030)
Urobilinogen, UA: 0.2 mg/dL (ref 0.0–1.0)

## 2010-11-30 LAB — CBC
HCT: 31.1 % — ABNORMAL LOW (ref 36.0–46.0)
Hemoglobin: 10.4 g/dL — ABNORMAL LOW (ref 12.0–15.0)
MCHC: 33.4 g/dL (ref 30.0–36.0)
MCHC: 33.5 g/dL (ref 30.0–36.0)
MCV: 93 fL (ref 78.0–100.0)
MCV: 93.3 fL (ref 78.0–100.0)
Platelets: 330 10*3/uL (ref 150–400)
RBC: 3.35 MIL/uL — ABNORMAL LOW (ref 3.87–5.11)
RDW: 15.1 % (ref 11.5–15.5)
WBC: 4.6 10*3/uL (ref 4.0–10.5)
WBC: 4.9 10*3/uL (ref 4.0–10.5)

## 2010-11-30 LAB — COMPREHENSIVE METABOLIC PANEL
ALT: 27 U/L (ref 0–35)
Alkaline Phosphatase: 56 U/L (ref 39–117)
BUN: 8 mg/dL (ref 6–23)
CO2: 32 mEq/L (ref 19–32)
Calcium: 9.1 mg/dL (ref 8.4–10.5)
Creatinine, Ser: 1.07 mg/dL (ref 0.4–1.2)
GFR calc non Af Amer: 55 mL/min — ABNORMAL LOW (ref >60)
Glucose, Bld: 87 mg/dL (ref 70–99)
Glucose, Bld: 93 mg/dL (ref 70–99)
Potassium: 3.1 mEq/L — ABNORMAL LOW (ref 3.5–5.1)
Sodium: 139 mEq/L (ref 135–145)
Total Protein: 6.1 g/dL (ref 6.0–8.3)

## 2010-11-30 LAB — CK TOTAL AND CKMB (NOT AT ARMC): Total CK: 717 U/L — ABNORMAL HIGH (ref 7–177)

## 2010-11-30 LAB — DIFFERENTIAL
Basophils Absolute: 0 10*3/uL (ref 0.0–0.1)
Basophils Relative: 0 % (ref 0–1)
Eosinophils Absolute: 0.1 10*3/uL (ref 0.0–0.7)
Lymphocytes Relative: 29 % (ref 12–46)
Lymphs Abs: 1.3 10*3/uL (ref 0.7–4.0)
Monocytes Absolute: 0.3 10*3/uL (ref 0.1–1.0)
Monocytes Relative: 7 % (ref 3–12)
Neutrophils Relative %: 60 % (ref 43–77)
Neutrophils Relative %: 64 % (ref 43–77)

## 2010-11-30 LAB — URINE MICROSCOPIC-ADD ON

## 2010-11-30 LAB — RPR: RPR Ser Ql: NONREACTIVE

## 2010-11-30 LAB — TROPONIN I: Troponin I: 0.03 ng/mL (ref 0.00–0.06)
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
## 2011-01-02 NOTE — Discharge Summary (Signed)
NAMEJENNEFER, [name]             ACCOUNT NO.:  [id number]   MEDICAL RECORD NO.:  [id number]          PATIENT TYPE:  INP   LOCATION:  1435                         [facility]ACILITY:  [facility]   PHYSICIAN:  Hillery Aldo, M.D.   DATE OF BIRTH:  1961/04/24   DATE OF ADMISSION:  11/17/2008  DATE OF DISCHARGE:  11/23/2008                               DISCHARGE SUMMARY   PRIMARY CARE PHYSICIAN:  Dr. Meredith Pel at the [facility].   CARDIOLOGIST:  Dr. Donnie Aho.   DISCHARGE DIAGNOSES:  1. Acute pyelonephritis.  2. Suspicion of portal hypertension and hepatocellular disease based      on CT scan and abdominal ultrasound.  Viral hepatitis studies      negative.  3. Untreated hypothyroidism with myxedema.  4. Pericardial effusion with early tamponade physiology.  5. Altered mental status, resolved.  6. History of chronic obstructive pulmonary disease with ongoing      tobacco abuse.  7. Normocytic anemia.  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  9. History of polysubstance abuse.  10.Mildly elevated CEA level, followup in 3 months recommended.   DISCHARGE MEDICATIONS:  1. Synthroid 100 mcg p.o. daily.  2. Ceftin 500 mg p.o. b.i.d. times 8 more days.  3. Vicodin 5/500 one tablet q.6 hours p.r.n. pain.  The patient was      instructed to use these sparingly as they are addictive, #20      written for with no refills.   CONSULTATIONS:  Dr. Donnie Aho of cardiology.   BRIEF ADMISSION HISTORY OF PRESENT ILLNESS:  The patient is a 50-year-  old female with a past medical history of hypothyroidism and medical  nonadherence.  She presented to the hospital with chief complaint of  abdominal pain, nausea, vomiting and diarrhea.  The patient had not been  taking any thyroid replacement for approximately 1 year.  She was  admitted for further evaluation and workup.  For full details, please  see the dictated report done by Dr. Allena Katz.   PROCEDURES AND DIAGNOSTIC STUDIES:  1. Acute  abdominal series on November 17, 2008 showed scattered fluid      levels within the colon which were abnormal but nonspecific.      Possible enteritis.  Suggestion of pulmonary emphysema.  2. CT scan of the abdomen and pelvis on November 17, 2008 showed a      moderate pericardial effusion.  Thickening of the distal esophagus;      question gastroesophageal reflux.  Atrophy of the superficial right      hepatic lobe.  Question post traumatic injury or prior infarct.      Probable portal hypertension.  Small amount of ascites.  Increased      enhancement of the uterus and mucosal surface of the small bowel.      Cause is unclear.  May relate to portal venous congestion.  Another      consideration would be hypoproteinemia.  Mild enlargement of the      uterine cervix.  Small amount of pelvic ascites without definite      source identified.  May also relate to hypoproteinemia  and      potentially portal hypertension.  3. Abdominal ultrasound on November 18, 2008 showed a contracted      gallbladder which may be the result of having eaten approximately 5      hours prior to the examination or may be secondary to      hepatocellular disease.  No gallstones.  Mildly increasing      coarsened echotexture in the liver consistent with fatty      infiltration and/or hepatocellular disease.  No focal parenchymal      abnormalities.  Trace perihepatic ascites as noted on the CT scan      yesterday.  There was not enough fluid to perform a paracentesis.  4. Chest x-ray on November 20, 2008 showed no active cardiopulmonary      disease.  5. Two-dimensional echocardiogram on November 18, 2008 showed normal left      ventricular systolic function with ejection fraction estimated 65%.      No left ventricular regional wall motion abnormality.  Left      ventricular wall thickness is mildly increased.  Left atrium was      mildly to moderately dilated.  Circumferential effusion but more      anterior than posterior with  evidence of early tamponade.  Moderate      to large pericardial effusion circumstantial to the heart.      Moderate right atrial chamber collapse.  Mild right ventricular      chamber collapse.  Features were consistent with mild tamponade      physiology.  6. Repeat echocardiogram done on November 22, 2008 showed normal left      ventricular systolic function with ejection fraction estimated 7%.      No left ventricular regional wall motion abnormality.  Moderate-to-      large circumferential effusion more anterior than posterior.  Left      right atrial collapse compared to the prior study and only minimal      RV collapse.  The IVC collapses with inspiration.  There was a      moderate to large pericardial effusion circumferential to the      heart.   DISCHARGE LABORATORY VALUES:  CK was 906.  White blood cell count was  4.3, hemoglobin 11.1, hematocrit 33.1, platelets 291,000.  Sodium was  138, potassium 4.8, chloride 100, bicarb 32, BUN 7, creatinine 0.93,  glucose 88.   HOSPITAL COURSE BY PROBLEM:  1. Pyelonephritis versus urinary tract infection with abdominal pain,      nausea, vomiting and diarrhea:  The patient was admitted and a      urinalysis did show positive nitrites, but no leukocytes.      Microscopy showed many bacteria and 3 to 6 white blood cells.      These findings were suspicious for an underlying diagnosis of      pyelonephritis versus urinary tract infection.  Unfortunately,      cultures were not sent before antibiotic therapy was empirically      started.  The patient was started on Rocephin and has been switched      over to p.o. Ceftin.  Because of diagnostic uncertainty, we will      continue her treatment for eight more days to complete a total      course of therapy with Ceftin for 2 weeks to treat possible      pyelonephritis.  She should have a followup urinalysis and culture  done on hospital followup to ensure no further evidence of a       urinary tract infection or pyelonephritis.  The patient      symptomatically improved with this treatment.  2. Untreated hypothyroidism:  The patient has been noncompliant with      her Synthroid and has been off therapy for 1 year.  Accordingly,      she has the sequelae of untreated hypothyroidism including      myxedema, pericardial effusion, and hypothyroid associated      myopathy.  The patient has been put on 100 mg of Synthroid      replacement therapy and has been counseled extensively regarding      importance of treating her hypothyroidism to avoid further problems      with pericardial effusion and myopathy.  3. Pericardial effusion with early tamponade:  The patient was      hypotensive and this prompted an evaluation for possible tamponade      physiology.  Effusion was noted on CT scanning and confirmed by two-      dimensional echocardiography.  Because of this, cardiology      consultation was requested and kindly provided by Dr. Donnie Aho.  The      patient did not have any evidence of physiologic compromise.  She      was monitored closely and hydrated.  A repeat echo did show some      mild improvement.  The patient has been set up for a repeat 2-      dimensional echocardiogram to be done at the [facility] on December 07, 2008 a 2 p.m.  Dr. Donnie Aho has agreed to read      this and to initial report and to see the patient 2 weeks after the      echo is done in his office.  The patient has been provided with Dr.      York Spaniel number and instructed to call for an appointment time.  4. Questionable portal hypertension/hepatocellular disease:  The      patient does not have any evidence of viral hepatitis.  A acute      viral hepatitis panel showed a negative hepatitis B surface      antigen, negative hepatitis B core antibody, negative hepatitis A      antibody, and negative hepatitis C antibody.  The patient has no      evidence of prior viral hepatitis.   Her fatty liver may simply be      due to her prior alcohol abuse.  She does have an elevated CEA      which is likely reflective of ongoing tobacco abuse and possible      early cirrhosis.  Doubt this is a primary GI malignancy given her      age.  Nevertheless, the patient should have a followup CEA level      done in 3 months to ensure stability and, if it is rising,      consideration for an outpatient GI evaluation with colonoscopy      should be considered.  5. Altered mental status:  The patient's altered mental status      resolved.  She was seen in consultation with Dr. Jeanie Sewer who      recommends [facility] for ongoing substance abuse counseling post      discharge.  6. Chronic obstructive pulmonary disease/tobacco abuse:  The patient  was counseled on the importance of cessation and provided with a      nicotine patch while in the hospital.  7. Hypothyroid-associated myopathy:  The patient had persistent      elevation of CK values consistent with hypothyroid myopathy.  This      should improve over the next few weeks as her thyroid function      normalizes with supplementation therapy.  8. History of polysubstance abuse:  Again, the patient was seen in      consultation with Dr. Jeanie Sewer.  Dr. Jeanie Sewer did recommend 12-      step methods for ongoing cessation and followup with [facility] for      cognitive behavioral therapy.  The patient denies any active      substance abuse but has been on methadone for heroin abuse in the      past.  Her urine drug screen was positive for opiates and      benzodiazepines, but she had received those in the emergency      department, so it is unclear if this was from being dosed with      these medications.  Additionally, her urine drug screen was      positive for THC.  We will have a Child psychotherapist set up followup at      [facility] prior to discharge.   DISPOSITION:  The patient is medically stable and will be discharged  home.   She does have followup with outpatient clinic with Dr. Elizebeth Koller  on December 07, 2008 at 9:30 a.m.  The patient is instructed to maintain  all of her appointment times and to bring all of her medications with  her to her appointment.   Time spent coordinating care for discharge and discharge instructions  equals 50 minutes.      Hillery Aldo, M.D.  Electronically Signed     CR/MEDQ  D:  11/23/2008  T:  11/23/2008  Job:  [id number]   cc:   Ileana Roup, M.D.  Fax: [phone]   W. Viann Fish, M.D.  Fax: [phone]  Email: [email or web address]
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
## 2011-01-02 NOTE — Op Note (Signed)
[name], [name]                ACCOUNT NO.:  [id number]   MEDICAL RECORD NO.:  [id number]          PATIENT TYPE:  INP   LOCATION:  1827                         FACILITY:  [facility]   PHYSICIAN:  Burnard Bunting, M.D.    DATE OF BIRTH:  1961-05-10   DATE OF PROCEDURE:  12/13/2008  DATE OF DISCHARGE:                               OPERATIVE REPORT   PREOPERATIVE DIAGNOSIS:  Left hip fracture dislocation.   POSTOPERATIVE DIAGNOSIS:  Left hip fracture dislocation.   PROCEDURE:  Left hip fracture dislocation reduction with distal femoral  pin placement.   SURGEON:  Burnard Bunting, MD   ASSISTANT:  None.   ANESTHESIA:  Moderate sedation.   INDICATIONS:  [name] is a 50 year old female with left hip  fracture dislocation who presents now for operative management of her  pelvic and acetabular fracture dislocation of the left hip.  The patient  is relatively incoherent because of pain medications.  Therefore, the  patient is explained the risks and benefits that need emergent hip  reduction and pin placement for traction to maintain reduction.   PROCEDURE IN DETAIL:  The patient was in room 11 in the ER.  The lateral  aspect of the knee was prepped with DuraPrep solution using sterile  technique and after moderate sedation, a time-out was called.  The left  leg was drilled across with a 1.25 K-wire.  This was drilled through the  bicondylar axis of the knee.  Pedal pulses were intact after this  drilling.  After placement of traction, the hip was reduced with  combination of flexion and traction with an audible clunk.  Leg lengths  were restored after traction was applied.  The patient tolerated the  procedure well without immediate complications.  Post-reduction  radiographs were pending.      Burnard Bunting, M.D.  Electronically Signed     GSD/MEDQ  D:  12/13/2008  T:  12/13/2008  Job:  [id number]
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
## 2011-01-02 NOTE — Consult Note (Signed)
NAMEDESTANY, SEVERNS                ACCOUNT NO.:  [id number]   MEDICAL RECORD NO.:  [id number]          PATIENT TYPE:  INP   LOCATION:  5024                         FACILITY:  [facility]   PHYSICIAN:  Antonietta Breach, M.D.  DATE OF BIRTH:  03-Feb-1961   DATE OF CONSULTATION:  12/22/2008  DATE OF DISCHARGE:  12/22/2008                                 CONSULTATION   HISTORY:  She is refusing skilled nursing facility.  This has been  recommended by her general medical attending physician.  The family  wants her to reside in a skilled nursing facility as well.   Ms. Rehmann does have intact orientation as well as memory function.  She  is not agitated or combative.  She is not having any hallucinations or  delusions.  She does have constructive goals and future interests.  She  also can reason well.  Her cloudiness of consciousness has resolved.   MENTAL STATUS EXAM:  As above.  Ms. Laakso is alert.  She has intact  concentration.  Her thought process is logical, coherent, goal directed.  No looseness of associations.  Thought content, no thoughts of harming  herself or others.  No delusions or hallucinations.  Her insight is  intact.  Judgment is intact.  Please see the above.   ASSESSMENT:  Her depression has resolved except for some mild decreased  energy.  Her delirium has resolved.   Ms. Desrocher does have the ability to make a consistent choice.  She can  differentiate between her postdischarge environment options and their  respective risks versus benefits.  She can recognize morbidity and  mortality risks as they directly apply to her.  She can appreciate these  risks.  She also can reason well.   She does have the capacity to choose her environment after discharge.  She explains that although there can be a reduced morbidity and  mortality risk at a placement facility, she wants to go home due to the  mental benefit of being at home.   She is not at risk to harm herself or others.   She agrees to call  emergency services immediately for any thoughts of harming herself,  thoughts of harming others or distress.   RECOMMENDATIONS:  1. Would have the social worker set up outpatient psychiatric followup      as soon as possible.  Clinics are available at the [facility], [place], [facility] and [facility].  2. Would increase her Paxil to 20 mg q.a.m. for the remaining      decreased energy of depression.  3. Would reduce her Haldol to 0.5 mg b.i.d. and then discontinue after      1 week of being out of the hospital and proceed with psychiatric      followup.      Antonietta Breach, M.D.  Electronically Signed     JW/MEDQ  D:  01/01/2009  T:  01/01/2009  Job:  [id number]

## 2011-01-02 NOTE — H&P (Signed)
[name], [name]                ACCOUNT NO.:  [id number]   MEDICAL RECORD NO.:  [id number]          PATIENT TYPE:  EMS   LOCATION:  ED                           FACILITY:  [facility]   PHYSICIAN:  Maryla Morrow, MD        DATE OF BIRTH:  Sep 01, 1960   DATE OF ADMISSION:  11/17/2008  DATE OF DISCHARGE:                              HISTORY & PHYSICAL   PRIMARY CARE PHYSICIAN:  Unassigned.   CHIEF COMPLAINT:  Abdominal pain.   HISTORY OF PRESENT ILLNESS:  [name] is a very pleasant but extremely  unfortunate 50 year old Caucasian female with history of hypothyroidism,  tobacco abuse and medical noncompliance who presented to the ED today  after signing AMA earlier this morning.  She comes in with a chief  complaint of abdominal pain.  She states that she came in last night  with a chief complaint of abdominal pain, diarrhea, nausea and vomiting  for the last 2 weeks with weight loss.  She got scared when she had the  understanding that she had cancer and left the hospital AMA.  She then  came back with abdominal pain for further medical evaluation.  Currently, she says that this started about 2 weeks ago when she started  having abdominal pain after eating followed by diarrhea associated with  meals.  She in the process lost 8 pounds in the last two weeks.  She  also admits that she has not been taking her thyroid medication which  probably is Synthroid 125 mcg daily for the last 3-4 months.  She was  filling her prescription by an outpatient clinic here at [facility].  She otherwise denies any fever, chills or sick contacts.  She denies any  camping trips.  She denies any shortness of breath, cough, congestion or  orthopnea.  She complains of dry skin.  She also complains of excessive  sleepiness lately.   PAST MEDICAL HISTORY:  Hypothyroidism.   PAST SURGICAL HISTORY:  Tubal ligation.   SOCIAL HISTORY:  The patient smokes about 5 cigarettes a day.  She  smoked about 2 packs a day  for several years.  She denies any drug abuse  or any alcohol abuse.  She is currently temporarily separated and has 3  children, the younger girl is with her at the bedside.   FAMILY HISTORY:  Significant for coronary artery disease and CHF in  mother, as well as history of oral cancer in her aunt, as well as  diabetes mellitus.   Her last menstrual period was 06/20/2008.   REVIEW OF SYSTEMS:  All pertinent positives in HPI, the patient had a  negative complete 12-point review of systems performed.   ALLERGIES:  1. DARVOCET N 100 CAUSING GASTRIC IRRITATION.  2. TORADOL.   She is on no other medications.   PHYSICAL EXAMINATION:  VITAL SIGNS:  Reveals a temperature of 97.6,  blood pressure 94/66, pulse 78, respirations 20.  Saturations 98%.  GENERAL:  The patient looks cachectic, otherwise in no acute apparent  distress.  HEENT:  Pupils equal, round and reactive to light.  Extraocular  movements intact.  Head is normocephalic.  Oropharynx is clear.  NECK:  No JVD, no lymphadenopathy.  CHEST:  Clear to auscultation bilaterally with equal expansion.  HEART:  Regular.  S1-S2 normal.  No murmur, rub or gallop.  Positive  hepatojugular reflex.  ABDOMEN:  Soft, nontender with umbilical hernia present in the midline  which is reducible.  EXTREMITIES:  Reveal no cyanosis.  Distal pulses are 2+ bilaterally.  Strength is 5/5 and intact.  NEUROLOGIC:  Speech is normal.  PSYCHIATRIC:  Mood and affect normal.  SKIN:  Reveals dry skin, but there is no rash or ulceration.   LABORATORY DATA:  Potassium 3.2, AST 43, ALT 29.  CK 717, troponin 0.03.  Urinalysis with many bacteria with positive nitrite.  Hemoglobin is  10.4, white count 4.6, platelets 330, neutrophils 60, lymphocytes 29.  Imaging; the patient had a CT scan of the abdomen and pelvis done  earlier this morning which revealed moderate pericardial effusion,  thickening of distal esophagus and atrophy of the superficial right lobe   with probable portal hypertension, small ascites, as well as increasing  enhancement in the uterus __________surface of the small bowel, cause is  unclear.  It may be related to portal venous congestion.  There was also  mild enlargement of the uterine cervix.  A small amount of pelvic  ascites without any definite source identified.   ASSESSMENT/PLAN:  1. Moderate pericardial effusion.  The etiology is multifactorial in      this lady.  However,  high in my differential is uncontrolled      hypothyroidism leading to this.  Other unlikely causes include      exudative versus transudative pericarditis related to effusion      versus idiopathic versus malignant which is unlikely, however,      cannot be excluded.  She currently, however, does not seem to have      any tamponade physiology as evident by no jugular venous      distention, stable vitals and absence of tachycardia.  2. Abdominal pain, diarrhea and dehydration.  This probably may be      related to uncontrolled hypothyroidism, however, is unlikely to      have diarrhea.  She does have increased excessive sleepiness which      may be another indication.  3. History of hypothyroidism with medical noncompliance.  4. Urinary tract infection.  5. Tobacco abuse.  6. Mild anemia.  7. Hypokalemia, probably secondary to diarrhea, dehydration and      vomiting.   RECOMMENDATIONS:  1. IV fluids.  Will check a 2-D echocardiogram in the morning for      further evaluation of pericardial effusion and any tamponade      physiology and systolic dysfunction.  We will start doing      cardiology consultation based on the report of the echo and the      patient's status.  2. Place patient on Levaquin for UTI.  3. We will also check a thyroid panel for workup of hypothyroidism.  4. We will check an anemia panel for workup of her anemia.  5. P.r.n. medications as well as SCDs as the patient has pericardial      effusion, avoid Lovenox.  6.  We will consider further workup for malignancy if thyroid panel is      normal and if otherwise clinically indicating, including involving      OB/GYN specialist.      Maryla Morrow, MD  Electronically Signed     AP/MEDQ  D:  11/17/2008  T:  11/17/2008  Job:  [id number]
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
## 2014-10-01 ENCOUNTER — Emergency Department (HOSPITAL_COMMUNITY)
Admission: EM | Admit: 2014-10-01 | Discharge: 2014-10-01 | Disposition: A | Discharge status: Home / Self Care | Payer: Self-pay | Attending: Emergency Medicine | Admitting: Emergency Medicine

## 2014-10-01 ENCOUNTER — Encounter (HOSPITAL_COMMUNITY): Payer: Self-pay

## 2014-10-01 ENCOUNTER — Emergency Department (HOSPITAL_COMMUNITY): Payer: Self-pay

## 2014-10-01 DIAGNOSIS — W1839XA Other fall on same level, initial encounter: Secondary | ICD-10-CM | POA: Insufficient documentation

## 2014-10-01 DIAGNOSIS — Y9289 Other specified places as the place of occurrence of the external cause: Secondary | ICD-10-CM | POA: Insufficient documentation

## 2014-10-01 DIAGNOSIS — M25569 Pain in unspecified knee: Secondary | ICD-10-CM

## 2014-10-01 DIAGNOSIS — Y998 Other external cause status: Secondary | ICD-10-CM | POA: Insufficient documentation

## 2014-10-01 DIAGNOSIS — Y9389 Activity, other specified: Secondary | ICD-10-CM | POA: Insufficient documentation

## 2014-10-01 DIAGNOSIS — S82002A Unspecified fracture of left patella, initial encounter for closed fracture: Secondary | ICD-10-CM

## 2014-10-01 DIAGNOSIS — Z720 Tobacco use: Secondary | ICD-10-CM | POA: Insufficient documentation

## 2014-10-01 DIAGNOSIS — Z8639 Personal history of other endocrine, nutritional and metabolic disease: Secondary | ICD-10-CM | POA: Insufficient documentation

## 2014-10-01 DIAGNOSIS — S82032A Displaced transverse fracture of left patella, initial encounter for closed fracture: Secondary | ICD-10-CM | POA: Insufficient documentation

## 2014-10-01 DIAGNOSIS — Z96642 Presence of left artificial hip joint: Secondary | ICD-10-CM | POA: Insufficient documentation

## 2014-10-01 HISTORY — DX: Disorder of thyroid, unspecified: E07.9

## 2014-10-01 MED ORDER — OXYCODONE-ACETAMINOPHEN 5-325 MG PO TABS: 1.0000 | ORAL_TABLET | Freq: Four times a day (QID) | ORAL | Status: DC | PRN

## 2014-10-01 MED ORDER — HYDROMORPHONE HCL 1 MG/ML IJ SOLN
1.0000 mg | Freq: Once | INTRAMUSCULAR | Status: AC
  Administered 2014-10-01: 1 mg via INTRAMUSCULAR
  Filled 2014-10-01: qty 1

## 2014-10-01 MED ORDER — DIAZEPAM 5 MG PO TABS
5.0000 mg | ORAL_TABLET | Freq: Once | ORAL | Status: AC
  Administered 2014-10-01: 5 mg via ORAL
  Filled 2014-10-01: qty 1

## 2014-10-01 MED ORDER — DIAZEPAM 5 MG PO TABS: 5.0000 mg | ORAL_TABLET | Freq: Three times a day (TID) | ORAL | Status: DC | PRN

## 2014-10-01 MED ORDER — OXYCODONE-ACETAMINOPHEN 5-325 MG PO TABS
2.0000 | ORAL_TABLET | Freq: Once | ORAL | Status: AC
Start: 2014-10-01 — End: 2014-10-01
  Administered 2014-10-01: 2 via ORAL
  Filled 2014-10-01: qty 2

## 2014-10-01 NOTE — ED Provider Notes (Signed)
CSN: [id number]     Arrival date & time 10/01/14  1312 History   First MD Initiated Contact with Patient 10/01/14 1337     Chief Complaint  Patient presents with  . Fall  . Knee Injury     (Consider location/radiation/quality/duration/timing/severity/associated sxs/prior Treatment) Patient is a 54 y.o. female presenting with fall. The history is provided by the patient.  Fall This is a new problem. The current episode started 1 to 2 hours ago. Episode frequency: once. The problem has not changed since onset.Pertinent negatives include no chest pain, no abdominal pain and no shortness of breath. Nothing aggravates the symptoms. Nothing relieves the symptoms. She has tried nothing for the symptoms.    Past Medical History  Diagnosis Date  . Thyroid disease    Past Surgical History  Procedure Laterality Date  . Hip surgery Left    No family history on file. History  Substance Use Topics  . Smoking status: Heavy Tobacco Smoker -- 1.00 packs/day for 35 years    Types: Cigarettes  . Smokeless tobacco: Not on file  . Alcohol Use: No   OB History    No data available     Review of Systems  Constitutional: Negative for fever.  Respiratory: Negative for cough and shortness of breath.   Cardiovascular: Negative for chest pain.  Gastrointestinal: Negative for vomiting and abdominal pain.  All other systems reviewed and are negative.     Allergies  Darvon and Percocet  Home Medications   Prior to Admission medications   Medication Sig Start Date End Date Taking? Authorizing Provider  naproxen sodium (ANAPROX) 220 MG tablet Take 220 mg by mouth 2 (two) times daily as needed (for pain).   Yes Historical Provider, MD  diazepam (VALIUM) 5 MG tablet Take 1 tablet (5 mg total) by mouth every 8 (eight) hours as needed for anxiety. 10/01/14   Elwin Mocha, MD  oxyCODONE-acetaminophen (PERCOCET) 5-325 MG per tablet Take 1-2 tablets by mouth every 6 (six) hours as needed for moderate  pain. 10/01/14   Elwin Mocha, MD   BP 143/82 mmHg  Pulse 70  Temp(Src) 97.8 F (36.6 C) (Oral)  Resp 18  Ht 5' (1.524 m)  Wt 115 lb (52.164 kg)  BMI 22.46 kg/m2  SpO2 99% Physical Exam  Constitutional: She is oriented to person, place, and time. She appears well-developed and well-nourished. No distress.  HENT:  Head: Normocephalic and atraumatic.  Mouth/Throat: Oropharynx is clear and moist.  Eyes: EOM are normal. Pupils are equal, round, and reactive to light.  Neck: Normal range of motion. Neck supple.  Cardiovascular: Normal rate and regular rhythm.  Exam reveals no friction rub.   No murmur heard. Pulmonary/Chest: Effort normal and breath sounds normal. No respiratory distress. She has no wheezes. She has no rales.  Abdominal: Soft. She exhibits no distension. There is no tenderness. There is no rebound.  Musculoskeletal: Normal range of motion. She exhibits no edema.       Left knee: She exhibits swelling and effusion. Tenderness (patella) found.  Neurological: She is alert and oriented to person, place, and time.  Skin: She is not diaphoretic.  Nursing note and vitals reviewed.   ED Course  Procedures (including critical care time) Labs Review Labs Reviewed - No data to display  Imaging Review Dg Knee Complete 4 Views Left  10/01/2014   CLINICAL DATA:  Fall onto left knee.  Initial encounter  EXAM: LEFT KNEE - COMPLETE 4+ VIEW  COMPARISON:  12/13/2008  FINDINGS: Comminuted, primarily transverse fracture through the mid patella with distraction and displacement distorting the articular surface. There is extensive surrounding soft tissue swelling and lipohemarthrosis. No femur or tibia fracture. Osteopenia.  IMPRESSION: 1. Displaced and distracted transverse patella fracture. 2. Lipohemarthrosis.   Electronically Signed   By: Marnee SpringJonathon  Watts M.D.   On: 10/01/2014 15:08   Dg Hip Unilat With Pelvis 2-3 Views Left  10/01/2014   CLINICAL DATA:  Fall from standing position with  left hip pain, initial encounter  EXAM: LEFT HIP (WITH PELVIS) 2-3 VIEWS  COMPARISON:  None.  FINDINGS: The left hip replacement is noted. Additionally postsurgical changes in the acetabulum are seen. No acute fracture or dislocation is noted. No device hardware failure is seen at this time. No soft tissue changes are noted.  IMPRESSION: Postsurgical changes without acute abnormality.   Electronically Signed   By: Alcide CleverMark  Lukens M.D.   On: 10/01/2014 15:16     EKG Interpretation None      MDM   Final diagnoses:  Knee pain  Fractured patella, left, closed, initial encounter    3F here after a fall. Fell onto her left knee. Has history of left hip replacement. Telemetry her leg give out. No chest pain cultures breath, dizziness, head injury. No loss of consciousness. Patient's left knee extremely tender and concern for possible patellar fracture since it is swollen and edematous. Mild hip pain. X-ray showed normal hip and transverse patella fracture. I spoke with Dr. Eulah PontMurphy of orthopedics who will follow up with her on Monday. Patient given Percocet, vitamin, crutches, knee immobilizer. Stable for discharge.  I have reviewed all labs and imaging and considered them in my medical decision making.   Elwin MochaBlair Taeja Debellis, MD 10/01/14 [phone]

## 2014-10-01 NOTE — Discharge Instructions (Signed)
Patellar Fracture, Adult °A patellar fracture is a break in your kneecap (patella).  °CAUSES  °· A direct blow to the knee or a fall is usually the cause of a broken patella. °· A very hard and strong bending of your knee can cause a patellar fracture. °RISK FACTORS °Involvement in contact sports, especially sports that involve a lot of jumping. °SIGNS AND SYMPTOMS  °· Tender and swollen knee. °· Pain when you move your knee, especially when you try to straighten out your leg. °· Difficulty walking or putting weight on your knee. °· Misshapen knee (as if a bone is out of place). °DIAGNOSIS  °Patellar fracture is usually diagnosed with a physical exam and an X-ray exam. °TREATMENT  °Treatment depends on the type of fracture: °· If your patella is still in the right position after the fracture and you can still straighten your leg out, you can usually be treated with a splint or cast for 4-6 weeks. °· If your patella is broken into multiple small pieces but you are able to straighten your leg, you can usually be treated with a splint or cast for 4-6 weeks. Sometimes your patella may need to be removed before the cast is applied. °· If you cannot straighten out your leg after a patellar fracture, then surgery is required to hold the bony fragments together until they heal. A cast or splint will be applied for 4-6 weeks. °HOME CARE INSTRUCTIONS  °· Only take over-the-counter or prescription medicines for pain, discomfort, or fever as directed by your health care provider. °· Use crutches as directed, and exercise the leg as directed. °· Apply ice to the injured area: °¨ Put ice in a plastic bag. °¨ Place a towel between your skin and the bag. °¨ Leave the ice on for 20 minutes, 2-3 times a day. °· Elevate the affected knee above the level of your heart. °SEEK MEDICAL CARE IF: °· You suspect you have significantly injured your knee. °· You hear a pop after a knee injury. °· Your knee is misshapen after a knee  injury. °· You have pain when you move your knee. °· You have difficulty walking or putting weight on your knee. °· You cannot fully move your knee. °SEEK IMMEDIATE MEDICAL CARE IF: °· You have redness, swelling, or increasing pain in your knee. °· You have a fever. °Document Released: 05/05/2003 Document Revised: 05/27/2013 Document Reviewed: 03/18/2013 °Exit[employer]® Patient Information ©2015 [employer]. This information is not intended to replace advice given to you by your health care provider. Make sure you discuss any questions you have with your health care provider. ° °

## 2014-10-01 NOTE — ED Notes (Signed)
Pt states left hip "gave out" and fell from standing position onto left knee.

## 2014-10-04 ENCOUNTER — Encounter (HOSPITAL_BASED_OUTPATIENT_CLINIC_OR_DEPARTMENT_OTHER): Payer: Self-pay | Admitting: *Deleted
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
## 2014-10-07 ENCOUNTER — Ambulatory Visit (HOSPITAL_BASED_OUTPATIENT_CLINIC_OR_DEPARTMENT_OTHER): Payer: Self-pay | Admitting: Anesthesiology

## 2014-10-07 ENCOUNTER — Encounter (HOSPITAL_BASED_OUTPATIENT_CLINIC_OR_DEPARTMENT_OTHER)
Admission: RE | Disposition: A | Discharge status: Home / Self Care | Payer: Self-pay | Source: Ambulatory Visit | Attending: Orthopedic Surgery

## 2014-10-07 ENCOUNTER — Ambulatory Visit (HOSPITAL_BASED_OUTPATIENT_CLINIC_OR_DEPARTMENT_OTHER)
Admission: RE | Admit: 2014-10-07 | Discharge: 2014-10-07 | Disposition: A | Discharge status: Home / Self Care | Payer: Self-pay | Source: Ambulatory Visit | Attending: Orthopedic Surgery | Admitting: Orthopedic Surgery

## 2014-10-07 ENCOUNTER — Encounter (HOSPITAL_BASED_OUTPATIENT_CLINIC_OR_DEPARTMENT_OTHER): Payer: Self-pay

## 2014-10-07 DIAGNOSIS — E039 Hypothyroidism, unspecified: Secondary | ICD-10-CM | POA: Insufficient documentation

## 2014-10-07 DIAGNOSIS — Z862 Personal history of diseases of the blood and blood-forming organs and certain disorders involving the immune mechanism: Secondary | ICD-10-CM | POA: Insufficient documentation

## 2014-10-07 DIAGNOSIS — F329 Major depressive disorder, single episode, unspecified: Secondary | ICD-10-CM | POA: Insufficient documentation

## 2014-10-07 DIAGNOSIS — J449 Chronic obstructive pulmonary disease, unspecified: Secondary | ICD-10-CM | POA: Insufficient documentation

## 2014-10-07 DIAGNOSIS — Y939 Activity, unspecified: Secondary | ICD-10-CM | POA: Insufficient documentation

## 2014-10-07 DIAGNOSIS — X58XXXA Exposure to other specified factors, initial encounter: Secondary | ICD-10-CM | POA: Insufficient documentation

## 2014-10-07 DIAGNOSIS — Y999 Unspecified external cause status: Secondary | ICD-10-CM | POA: Insufficient documentation

## 2014-10-07 DIAGNOSIS — Z79899 Other long term (current) drug therapy: Secondary | ICD-10-CM | POA: Insufficient documentation

## 2014-10-07 DIAGNOSIS — Z86718 Personal history of other venous thrombosis and embolism: Secondary | ICD-10-CM | POA: Insufficient documentation

## 2014-10-07 DIAGNOSIS — S82002D Unspecified fracture of left patella, subsequent encounter for closed fracture with routine healing: Secondary | ICD-10-CM

## 2014-10-07 DIAGNOSIS — S82002A Unspecified fracture of left patella, initial encounter for closed fracture: Secondary | ICD-10-CM | POA: Insufficient documentation

## 2014-10-07 DIAGNOSIS — Y929 Unspecified place or not applicable: Secondary | ICD-10-CM | POA: Insufficient documentation

## 2014-10-07 DIAGNOSIS — F17210 Nicotine dependence, cigarettes, uncomplicated: Secondary | ICD-10-CM | POA: Insufficient documentation

## 2014-10-07 HISTORY — DX: Depression, unspecified: F32.A

## 2014-10-07 HISTORY — DX: Unspecified hearing loss, unspecified ear: H91.90

## 2014-10-07 HISTORY — DX: Hypothyroidism, unspecified: E03.9

## 2014-10-07 HISTORY — DX: Presence of dental prosthetic device (complete) (partial): Z97.2

## 2014-10-07 HISTORY — DX: Major depressive disorder, single episode, unspecified: F32.9

## 2014-10-07 HISTORY — PX: ORIF PATELLA: SHX5033

## 2014-10-07 HISTORY — DX: Anemia, unspecified: D64.9

## 2014-10-07 HISTORY — DX: Chronic obstructive pulmonary disease, unspecified: J44.9

## 2014-10-07 LAB — POCT HEMOGLOBIN-HEMACUE: Hemoglobin: 13.2 g/dL (ref 12.0–15.0)

## 2014-10-07 SURGERY — OPEN REDUCTION INTERNAL FIXATION (ORIF) PATELLA
Anesthesia: General | Site: Knee | Laterality: Left

## 2014-10-07 MED ORDER — HYDROMORPHONE HCL 1 MG/ML IJ SOLN
0.2500 mg | INTRAMUSCULAR | Status: DC | PRN
  Administered 2014-10-07: 0.25 mg via INTRAVENOUS
  Administered 2014-10-07: 0.5 mg via INTRAVENOUS

## 2014-10-07 MED ORDER — PHENYLEPHRINE HCL 10 MG/ML IJ SOLN
INTRAMUSCULAR | Status: DC | PRN
  Administered 2014-10-07: 40 ug via INTRAVENOUS

## 2014-10-07 MED ORDER — HYDROMORPHONE HCL 1 MG/ML IJ SOLN
INTRAMUSCULAR | Status: AC
  Filled 2014-10-07: qty 1

## 2014-10-07 MED ORDER — ACETAMINOPHEN 500 MG PO TABS
ORAL_TABLET | ORAL | Status: AC
  Filled 2014-10-07: qty 2

## 2014-10-07 MED ORDER — OXYCODONE HCL 5 MG PO TABS
5.0000 mg | ORAL_TABLET | Freq: Once | ORAL | Status: AC
  Administered 2014-10-07: 5 mg via ORAL

## 2014-10-07 MED ORDER — ACETAMINOPHEN 500 MG PO TABS
1000.0000 mg | ORAL_TABLET | Freq: Once | ORAL | Status: AC
  Administered 2014-10-07: 1000 mg via ORAL

## 2014-10-07 MED ORDER — CHLORHEXIDINE GLUCONATE 4 % EX LIQD: 60.0000 mL | Freq: Once | CUTANEOUS | Status: DC

## 2014-10-07 MED ORDER — CEFAZOLIN SODIUM-DEXTROSE 2-3 GM-% IV SOLR
INTRAVENOUS | Status: AC
  Filled 2014-10-07: qty 50

## 2014-10-07 MED ORDER — FENTANYL CITRATE 0.05 MG/ML IJ SOLN
INTRAMUSCULAR | Status: AC
  Filled 2014-10-07: qty 2

## 2014-10-07 MED ORDER — OXYCODONE HCL 5 MG PO TABS
ORAL_TABLET | ORAL | Status: AC
  Filled 2014-10-07: qty 1

## 2014-10-07 MED ORDER — BUPIVACAINE-EPINEPHRINE (PF) 0.5% -1:200000 IJ SOLN
INTRAMUSCULAR | Status: DC | PRN
  Administered 2014-10-07: 30 mL via PERINEURAL

## 2014-10-07 MED ORDER — LACTATED RINGERS IV SOLN
INTRAVENOUS | Status: DC
  Administered 2014-10-07 (×2): via INTRAVENOUS

## 2014-10-07 MED ORDER — PROPOFOL 10 MG/ML IV BOLUS
INTRAVENOUS | Status: DC | PRN
  Administered 2014-10-07: 150 mg via INTRAVENOUS

## 2014-10-07 MED ORDER — MIDAZOLAM HCL 2 MG/2ML IJ SOLN
INTRAMUSCULAR | Status: AC
  Filled 2014-10-07: qty 2

## 2014-10-07 MED ORDER — DEXTROSE-NACL 5-0.45 % IV SOLN: INTRAVENOUS | Status: DC

## 2014-10-07 MED ORDER — ONDANSETRON HCL 4 MG/2ML IJ SOLN: 4.0000 mg | Freq: Once | INTRAMUSCULAR | Status: DC | PRN

## 2014-10-07 MED ORDER — CEFAZOLIN SODIUM-DEXTROSE 2-3 GM-% IV SOLR: 2.0000 g | INTRAVENOUS | Status: DC

## 2014-10-07 MED ORDER — ONDANSETRON HCL 4 MG/2ML IJ SOLN
INTRAMUSCULAR | Status: DC | PRN
  Administered 2014-10-07: 4 mg via INTRAVENOUS

## 2014-10-07 MED ORDER — EPHEDRINE SULFATE 50 MG/ML IJ SOLN
INTRAMUSCULAR | Status: DC | PRN
  Administered 2014-10-07: 10 mg via INTRAVENOUS
  Administered 2014-10-07: 15 mg via INTRAVENOUS

## 2014-10-07 MED ORDER — FENTANYL CITRATE 0.05 MG/ML IJ SOLN
50.0000 ug | INTRAMUSCULAR | Status: DC | PRN
  Administered 2014-10-07: 100 ug via INTRAVENOUS

## 2014-10-07 MED ORDER — HYDROMORPHONE HCL 2 MG PO TABS: 2.0000 mg | ORAL_TABLET | ORAL | Status: DC | PRN

## 2014-10-07 MED ORDER — MEPERIDINE HCL 25 MG/ML IJ SOLN: 6.2500 mg | INTRAMUSCULAR | Status: DC | PRN

## 2014-10-07 MED ORDER — LIDOCAINE HCL (CARDIAC) 20 MG/ML IV SOLN
INTRAVENOUS | Status: DC | PRN
  Administered 2014-10-07: 50 mg via INTRAVENOUS

## 2014-10-07 MED ORDER — MIDAZOLAM HCL 2 MG/2ML IJ SOLN
1.0000 mg | INTRAMUSCULAR | Status: DC | PRN
  Administered 2014-10-07: 2 mg via INTRAVENOUS

## 2014-10-07 MED ORDER — FENTANYL CITRATE 0.05 MG/ML IJ SOLN
INTRAMUSCULAR | Status: AC
  Filled 2014-10-07: qty 4

## 2014-10-07 MED ORDER — DEXAMETHASONE SODIUM PHOSPHATE 10 MG/ML IJ SOLN
INTRAMUSCULAR | Status: DC | PRN
  Administered 2014-10-07: 10 mg via INTRAVENOUS

## 2014-10-07 SURGICAL SUPPLY — 71 items
BANDAGE ELASTIC 4 VELCRO ST LF (GAUZE/BANDAGES/DRESSINGS) ×2 IMPLANT
BANDAGE ELASTIC 6 VELCRO ST LF (GAUZE/BANDAGES/DRESSINGS) ×1 IMPLANT
BANDAGE ESMARK 6X9 LF (GAUZE/BANDAGES/DRESSINGS) ×1 IMPLANT
BIT DRILL CANN 2.7 (BIT) ×2
BIT DRILL SRG 2.7XCANN AO CPLG (BIT) IMPLANT
BIT DRL SRG 2.7XCANN AO CPLNG (BIT) ×1
BLADE SURG 15 STRL LF DISP TIS (BLADE) ×2 IMPLANT
BLADE SURG 15 STRL SS (BLADE) ×4
BNDG CMPR 9X6 STRL LF SNTH (GAUZE/BANDAGES/DRESSINGS) ×1
BNDG COHESIVE 4X5 TAN STRL (GAUZE/BANDAGES/DRESSINGS) IMPLANT
BNDG ESMARK 6X9 LF (GAUZE/BANDAGES/DRESSINGS) ×2
CHLORAPREP W/TINT 26ML (MISCELLANEOUS) ×3 IMPLANT
CLSR STERI-STRIP ANTIMIC 1/2X4 (GAUZE/BANDAGES/DRESSINGS) ×2 IMPLANT
CUFF TOURNIQUET SINGLE 24IN (TOURNIQUET CUFF) IMPLANT
CUFF TOURNIQUET SINGLE 34IN LL (TOURNIQUET CUFF) ×1 IMPLANT
DECANTER SPIKE VIAL GLASS SM (MISCELLANEOUS) IMPLANT
DRAPE C-ARM 42X72 X-RAY (DRAPES) ×1 IMPLANT
DRAPE C-ARMOR (DRAPES) ×1 IMPLANT
DRAPE EXTREMITY T 121X128X90 (DRAPE) ×2 IMPLANT
DRAPE OEC MINIVIEW 54X84 (DRAPES) ×2 IMPLANT
DRAPE U-SHAPE 47X51 STRL (DRAPES) ×1 IMPLANT
DRSG EMULSION OIL 3X3 NADH (GAUZE/BANDAGES/DRESSINGS) IMPLANT
ELECT REM PT RETURN 9FT ADLT (ELECTROSURGICAL) ×2
ELECTRODE REM PT RTRN 9FT ADLT (ELECTROSURGICAL) ×1 IMPLANT
FIBERSTICK 2 (SUTURE) ×1 IMPLANT
GAUZE SPONGE 4X4 12PLY STRL (GAUZE/BANDAGES/DRESSINGS) ×2 IMPLANT
GLOVE BIO SURGEON STRL SZ7.5 (GLOVE) ×2 IMPLANT
GLOVE BIOGEL M STRL SZ7.5 (GLOVE) ×1 IMPLANT
GLOVE BIOGEL PI IND STRL 7.0 (GLOVE) IMPLANT
GLOVE BIOGEL PI IND STRL 8 (GLOVE) ×1 IMPLANT
GLOVE BIOGEL PI INDICATOR 7.0 (GLOVE) ×2
GLOVE BIOGEL PI INDICATOR 8 (GLOVE) ×2
GLOVE SURG SS PI 7.0 STRL IVOR (GLOVE) ×2 IMPLANT
GOWN STRL REUS W/ TWL LRG LVL3 (GOWN DISPOSABLE) ×4 IMPLANT
GOWN STRL REUS W/ TWL XL LVL3 (GOWN DISPOSABLE) ×1 IMPLANT
GOWN STRL REUS W/TWL LRG LVL3 (GOWN DISPOSABLE) ×6
GOWN STRL REUS W/TWL XL LVL3 (GOWN DISPOSABLE) ×2
IMMOBILIZER KNEE 22 UNIV (SOFTGOODS) IMPLANT
IMMOBILIZER KNEE 24 THIGH 36 (MISCELLANEOUS) IMPLANT
IMMOBILIZER KNEE 24 UNIV (MISCELLANEOUS)
K-WIRE ORTHOPEDIC 1.4X150L (WIRE) ×6
KWIRE ORTHOPEDIC 1.4X150L (WIRE) IMPLANT
NDL KEITH SZ10 STRAIGHT (NEEDLE) IMPLANT
NEEDLE HYPO 22GX1.5 SAFETY (NEEDLE) IMPLANT
NEEDLE KEITH SZ10 STRAIGHT (NEEDLE) ×2 IMPLANT
NS IRRIG 1000ML POUR BTL (IV SOLUTION) ×2 IMPLANT
PACK ARTHROSCOPY DSU (CUSTOM PROCEDURE TRAY) ×1 IMPLANT
PACK BASIN DAY SURGERY FS (CUSTOM PROCEDURE TRAY) ×2 IMPLANT
PAD CAST 4YDX4 CTTN HI CHSV (CAST SUPPLIES) ×1 IMPLANT
PADDING CAST COTTON 4X4 STRL (CAST SUPPLIES) ×2
PADDING CAST COTTON 6X4 STRL (CAST SUPPLIES) ×1 IMPLANT
PENCIL BUTTON HOLSTER BLD 10FT (ELECTRODE) ×2 IMPLANT
SCREW CANN 4.0X30 STRL (Screw) ×1 IMPLANT
SCREW CANN 4.0X32MM (Screw) ×1 IMPLANT
SLEEVE SCD COMPRESS KNEE MED (MISCELLANEOUS) ×1 IMPLANT
SPLINT FAST PLASTER 5X30 (CAST SUPPLIES) ×30
SPLINT PLASTER CAST FAST 5X30 (CAST SUPPLIES) IMPLANT
SPONGE LAP 4X18 X RAY DECT (DISPOSABLE) ×2 IMPLANT
SUCTION FRAZIER TIP 10 FR DISP (SUCTIONS) ×1 IMPLANT
SUT ETHILON 3 0 PS 1 (SUTURE) ×1 IMPLANT
SUT MON AB 2-0 CT1 36 (SUTURE) ×1 IMPLANT
SUT MON AB 4-0 PC3 18 (SUTURE) ×2 IMPLANT
SUT VIC AB 0 SH 27 (SUTURE) ×1 IMPLANT
SUT VIC AB 2-0 SH 27 (SUTURE) ×2
SUT VIC AB 2-0 SH 27XBRD (SUTURE) IMPLANT
SYR BULB 3OZ (MISCELLANEOUS) ×2 IMPLANT
TOWEL OR 17X24 6PK STRL BLUE (TOWEL DISPOSABLE) ×2 IMPLANT
TOWEL OR NON WOVEN STRL DISP B (DISPOSABLE) ×1 IMPLANT
TUBE CONNECTING 20X1/4 (TUBING) IMPLANT
UNDERPAD 30X30 INCONTINENT (UNDERPADS AND DIAPERS) ×2 IMPLANT
YANKAUER SUCT BULB TIP NO VENT (SUCTIONS) ×1 IMPLANT

## 2014-10-07 NOTE — Op Note (Signed)
10/07/2014  3:17 PM  PATIENT:  [name]    PRE-OPERATIVE DIAGNOSIS:  LEFT PATELLA FRACTURE  POST-OPERATIVE DIAGNOSIS:  Same  PROCEDURE:  OPEN REDUCTION INTERNAL (ORIF) PATELLA LEFT  SURGEON:  Sheral ApleyMURPHY, Emireth Cockerham, D, MD  PHYSICIAN ASSISTANT: Janalee DaneBrittney Kelly, PA-C, She was present and scrubbed throughout the case, critical for completion in a timely fashion, and for retraction, instrumentation, and closure.   ANESTHESIA:   General  PREOPERATIVE INDICATIONS:  [name] is a  54 y.o. female with a diagnosis of LEFT PATELLA FRACTURE who elected for surgical management in order to restore the function of the extensor mechanism.    The risks benefits and alternatives were discussed with the patient preoperatively including but not limited to the risks of infection, bleeding, nerve injury, cardiopulmonary complications, the need for revision surgery, hardware prominence, hardware failure, the need for hardware removal, nonunion, malunion, posttraumatic arthritis, stiffness, loss of strength and function, among others, and the patient was willing to proceed.  OPERATIVE IMPLANTS: 4.0 mm cannulated screws x2 with a total of 2 #2 FiberWire going through the cannulated screws in a figure-of-eight cerclage fashion  OPERATIVE FINDINGS: Displaced patella fracture  OPERATIVE PROCEDURE: The patient was brought to the operating room and placed in the supine position. General anesthesia was administered. IV antibiotics were given. The lower extremity was prepped and draped in usual sterile fashion. The leg was elevated and exsanguinated and the tourniquet was inflated. Time out was performed.   Anterior incision was made over the patella and the fracture fragments identified and cleaned of hematoma. The retinaculum was torn on either side.  I reduced the fracture anatomically and held provisionally with a clamp and placed 2 guidewires for the cannulated screws.  The lengths were measured, after  being confirmed on C-arm, and then I placed the screws, taking care to make sure that there were threads only on the proximal segment, providing compression at the fracture site, and the tips were not prominent proximally.  C-arm used to confirm reduction and position of the screws, and once I was satisfied with this I then used a Keith needle through the screws bringing a total of 1 #2 FiberWire in a figure-of-eight type fashion. This provided excellent secondary fixation. I left the screw lengths slightly short of the far cortex in order to minimize the risk for rupture of the FiberWire over the tip of the screws.  The wounds were irrigated copiously and the retinaculum repaired with Vicryl followed by Vicryl for the subcutaneous tissue with Steri-Strips and sterile gauze for the skin. The wounds were also injected. A knee immobilizer was applied. The patient was awakened and returned to the PACU in stable and satisfactory condition. There were no complications.   POSTOPERATIVE PLAN: WBAT in knee immobilizer, DVT px: ASA 81 post op for 30 days

## 2014-10-07 NOTE — Anesthesia Postprocedure Evaluation (Signed)
Anesthesia Post Note  Patient: [name]  Procedure(s) Performed: Procedure(s) (LRB): OPEN REDUCTION INTERNAL (ORIF) PATELLA LEFT (Left)  Anesthesia type: general  Patient location: PACU  Post pain: Pain level controlled  Post assessment: Patient's Cardiovascular Status Stable  Last Vitals:  Filed Vitals:   10/07/14 1600  BP: 101/61  Pulse: 69  Temp:   Resp: 14    Post vital signs: Reviewed and stable  Level of consciousness: sedated  Complications: No apparent anesthesia complications

## 2014-10-07 NOTE — Progress Notes (Addendum)
Pt c/o left leg feeling hot, K. Younts RN, Jacqualine Codeonald Ferguson RN, Lindwood QuaBrittany Kelly PA at bedside felt leg felt hot where splint was placed ,PA removed splint and ace bandage while D. Emelda FearFerguson RN held her leg. [place]any wrapped left leg with webril, applied splint and wrapped with ace bandage - pt stated leg felt better.

## 2014-10-07 NOTE — Anesthesia Preprocedure Evaluation (Signed)
Anesthesia Evaluation  Patient identified by MRN, date of birth, ID band  Reviewed: Allergy & Precautions, NPO status , Patient's Chart, lab work & pertinent test results  Airway Mallampati: I  TM Distance: >3 FB Neck ROM: Full    Dental   Pulmonary COPDCurrent Smoker,          Cardiovascular     Neuro/Psych    GI/Hepatic   Endo/Other    Renal/GU      Musculoskeletal   Abdominal   Peds  Hematology   Anesthesia Other Findings   Reproductive/Obstetrics                             Anesthesia Physical Anesthesia Plan  ASA: II  Anesthesia Plan: General   Post-op Pain Management:    Induction: Intravenous  Airway Management Planned: LMA  Additional Equipment:   Intra-op Plan:   Post-operative Plan: Extubation in OR  Informed Consent: I have reviewed the patients History and Physical, chart, labs and discussed the procedure including the risks, benefits and alternatives for the proposed anesthesia with the patient or authorized representative who has indicated his/her understanding and acceptance.     Plan Discussed with: CRNA and Surgeon  Anesthesia Plan Comments:         Anesthesia Quick Evaluation

## 2014-10-07 NOTE — Anesthesia Procedure Notes (Addendum)
Anesthesia Regional Block:  Adductor canal block  Pre-Anesthetic Checklist: ,, timeout performed, Correct Patient, Correct Site, Correct Laterality, Correct Procedure, Correct Position, site marked, Risks and benefits discussed,  Surgical consent,  Pre-op evaluation,  At surgeon's request and post-op pain management  Laterality: Left  Prep: chloraprep       Needles:  Injection technique: Single-shot  Needle Type: Echogenic Stimulator Needle     Needle Length: 9cm 9 cm Needle Gauge: 21 and 21 G    Additional Needles:  Procedures: ultrasound guided (picture in chart) Adductor canal block Narrative:  Start time: 10/07/2014 1:10 PM End time: 10/07/2014 1:20 PM Injection made incrementally with aspirations every 5 mL.  Performed by: Personally  Anesthesiologist: Arta BruceSSEY, KEVIN  Additional Notes: Monitors applied. Patient sedated. Sterile prep and drape,hand hygiene and sterile gloves were used. Relevant anatomy identified.Needle position confirmed.Local anesthetic injected incrementally after negative aspiration. Local anesthetic spread visualized around nerve(s). Vascular puncture avoided. No complications. Image printed for medical record.The patient tolerated the procedure well.    Arta BruceKevin Ossey MD   Procedure Name: LMA Insertion Date/Time: 10/07/2014 1:42 PM Performed by: Zenia ResidesPAYNE, Haru Shaff D Pre-anesthesia Checklist: Patient identified, Emergency Drugs available, Suction available and Patient being monitored Patient Re-evaluated:Patient Re-evaluated prior to inductionOxygen Delivery Method: Circle System Utilized Preoxygenation: Pre-oxygenation with 100% oxygen Intubation Type: IV induction Ventilation: Mask ventilation without difficulty LMA: LMA inserted LMA Size: 4.0 Number of attempts: 1 Airway Equipment and Method: Bite block Placement Confirmation: positive ETCO2 Tube secured with: Tape Dental Injury: Teeth and Oropharynx as per pre-operative assessment

## 2014-10-07 NOTE — Progress Notes (Signed)
Assisted Dr. Ossey with left, ultrasound guided, femoral block. Side rails up, monitors on throughout procedure. See vital signs in flow sheet. Tolerated Procedure well. 

## 2014-10-07 NOTE — Discharge Instructions (Signed)
Keep your splint intact until follow up  Take a baby aspirin daily for 30 days  Elevate your leg to decrease swelling    Post Anesthesia Home Care Instructions  Activity: Get plenty of rest for the remainder of the day. A responsible adult should stay with you for 24 hours following the procedure.  For the next 24 hours, DO NOT: -Drive a car -Advertising copywriterperate machinery -Drink alcoholic beverages -Take any medication unless instructed by your physician -Make any legal decisions or sign important papers.  Meals: Start with liquid foods such as gelatin or soup. Progress to regular foods as tolerated. Avoid greasy, spicy, heavy foods. If nausea and/or vomiting occur, drink only clear liquids until the nausea and/or vomiting subsides. Call your physician if vomiting continues.  Special Instructions/Symptoms: Your throat may feel dry or sore from the anesthesia or the breathing tube placed in your throat during surgery. If this causes discomfort, gargle with warm salt water. The discomfort should disappear within 24 hours.  Regional Anesthesia Blocks  1. Numbness or the inability to move the "blocked" extremity may last from 3-48 hours after placement. The length of time depends on the medication injected and your individual response to the medication. If the numbness is not going away after 48 hours, call your surgeon.  2. The extremity that is blocked will need to be protected until the numbness is gone and the  Strength has returned. Because you cannot feel it, you will need to take extra care to avoid injury. Because it may be weak, you may have difficulty moving it or using it. You may not know what position it is in without looking at it while the block is in effect.  3. For blocks in the legs and feet, returning to weight bearing and walking needs to be done carefully. You will need to wait until the numbness is entirely gone and the strength has returned. You should be able to move your leg  and foot normally before you try and bear weight or walk. You will need someone to be with you when you first try to ensure you do not fall and possibly risk injury.  4. Bruising and tenderness at the needle site are common side effects and will resolve in a few days.  5. Persistent numbness or new problems with movement should be communicated to the surgeon or the [facility] [phone])/ [facility] [phone]).

## 2014-10-07 NOTE — Transfer of Care (Signed)
Immediate Anesthesia Transfer of Care Note  Patient: Julie ChattersDonna J Conaway  Procedure(s) Performed: Procedure(s): OPEN REDUCTION INTERNAL (ORIF) PATELLA LEFT (Left)  Patient Location: PACU  Anesthesia Type:General and Regional  Level of Consciousness: awake, alert  and oriented  Airway & Oxygen Therapy: Patient Spontanous Breathing and Patient connected to face mask oxygen  Post-op Assessment: Report given to RN and Post -op Vital signs reviewed and stable  Post vital signs: Reviewed and stable  Last Vitals:  Filed Vitals:   10/07/14 1525  BP: 126/79  Pulse: 86  Temp:   Resp:     Complications: No apparent anesthesia complications

## 2014-10-07 NOTE — Interval H&P Note (Signed)
History and Physical Interval Note:  10/07/2014 11:56 AM  [name]  has presented today for surgery, with the diagnosis of LEFT PATELLA FRACTURE  The various methods of treatment have been discussed with the patient and family. After consideration of risks, benefits and other options for treatment, the patient has consented to  Procedure(s): OPEN REDUCTION INTERNAL (ORIF) PATELLA LEFT (Left) as a surgical intervention .  The patient's history has been reviewed, patient examined, no change in status, stable for surgery.  I have reviewed the patient's chart and labs.  Questions were answered to the patient's satisfaction.     Jazline Cumbee, D

## 2014-10-08 ENCOUNTER — Encounter (HOSPITAL_BASED_OUTPATIENT_CLINIC_OR_DEPARTMENT_OTHER): Payer: Self-pay | Admitting: Orthopedic Surgery

## 2015-02-03 ENCOUNTER — Emergency Department (HOSPITAL_COMMUNITY)
Admission: EM | Admit: 2015-02-03 | Discharge: 2015-02-03 | Disposition: A | Discharge status: Home / Self Care | Payer: Self-pay | Attending: Emergency Medicine | Admitting: Emergency Medicine

## 2015-02-03 ENCOUNTER — Emergency Department (HOSPITAL_COMMUNITY): Payer: Self-pay

## 2015-02-03 ENCOUNTER — Encounter (HOSPITAL_COMMUNITY): Payer: Self-pay | Admitting: Nurse Practitioner

## 2015-02-03 DIAGNOSIS — Y998 Other external cause status: Secondary | ICD-10-CM | POA: Insufficient documentation

## 2015-02-03 DIAGNOSIS — M25561 Pain in right knee: Secondary | ICD-10-CM

## 2015-02-03 DIAGNOSIS — Z8659 Personal history of other mental and behavioral disorders: Secondary | ICD-10-CM | POA: Insufficient documentation

## 2015-02-03 DIAGNOSIS — M25562 Pain in left knee: Secondary | ICD-10-CM

## 2015-02-03 DIAGNOSIS — Z043 Encounter for examination and observation following other accident: Secondary | ICD-10-CM

## 2015-02-03 DIAGNOSIS — Y9389 Activity, other specified: Secondary | ICD-10-CM | POA: Insufficient documentation

## 2015-02-03 DIAGNOSIS — J449 Chronic obstructive pulmonary disease, unspecified: Secondary | ICD-10-CM | POA: Insufficient documentation

## 2015-02-03 DIAGNOSIS — S8992XA Unspecified injury of left lower leg, initial encounter: Secondary | ICD-10-CM | POA: Insufficient documentation

## 2015-02-03 DIAGNOSIS — H9190 Unspecified hearing loss, unspecified ear: Secondary | ICD-10-CM | POA: Insufficient documentation

## 2015-02-03 DIAGNOSIS — S8991XA Unspecified injury of right lower leg, initial encounter: Secondary | ICD-10-CM | POA: Insufficient documentation

## 2015-02-03 DIAGNOSIS — Z720 Tobacco use: Secondary | ICD-10-CM | POA: Insufficient documentation

## 2015-02-03 DIAGNOSIS — S8001XA Contusion of right knee, initial encounter: Secondary | ICD-10-CM | POA: Insufficient documentation

## 2015-02-03 DIAGNOSIS — Z041 Encounter for examination and observation following transport accident: Secondary | ICD-10-CM

## 2015-02-03 DIAGNOSIS — Z98811 Dental restoration status: Secondary | ICD-10-CM | POA: Insufficient documentation

## 2015-02-03 DIAGNOSIS — Z8639 Personal history of other endocrine, nutritional and metabolic disease: Secondary | ICD-10-CM | POA: Insufficient documentation

## 2015-02-03 DIAGNOSIS — Z862 Personal history of diseases of the blood and blood-forming organs and certain disorders involving the immune mechanism: Secondary | ICD-10-CM | POA: Insufficient documentation

## 2015-02-03 DIAGNOSIS — S0990XA Unspecified injury of head, initial encounter: Secondary | ICD-10-CM | POA: Insufficient documentation

## 2015-02-03 DIAGNOSIS — Y92410 Unspecified street and highway as the place of occurrence of the external cause: Secondary | ICD-10-CM | POA: Insufficient documentation

## 2015-02-03 MED ORDER — HYDROCODONE-ACETAMINOPHEN 5-325 MG PO TABS
1.0000 | ORAL_TABLET | Freq: Once | ORAL | Status: AC
  Administered 2015-02-03: 1 via ORAL
  Filled 2015-02-03: qty 1

## 2015-02-03 MED ORDER — IBUPROFEN 800 MG PO TABS: 800.0000 mg | ORAL_TABLET | Freq: Three times a day (TID) | ORAL | Status: DC

## 2015-02-03 MED ORDER — METHOCARBAMOL 500 MG PO TABS: 500.0000 mg | ORAL_TABLET | Freq: Two times a day (BID) | ORAL | Status: DC | PRN
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
## 2015-02-03 NOTE — Discharge Instructions (Signed)
Knee Pain °Knee pain can be a result of an injury or other medical conditions. Treatment will depend on the cause of your pain. °HOME CARE °· Only take medicine as told by your doctor. °· Keep a healthy weight. Being overweight can make the knee hurt more. °· Stretch before exercising or playing sports. °· If there is constant knee pain, change the way you exercise. Ask your doctor for advice. °· Make sure shoes fit well. Choose the right shoe for the sport or activity. °· Protect your knees. Wear kneepads if needed. °· Rest when you are tired. °GET HELP RIGHT AWAY IF:  °· Your knee pain does not stop. °· Your knee pain does not get better. °· Your knee joint feels hot to the touch. °· You have a fever. °MAKE SURE YOU:  °· Understand these instructions. °· Will watch this condition. °· Will get help right away if you are not doing well or get worse. °Document Released: 11/02/2008 Document Revised: 10/29/2011 Document Reviewed: 11/02/2008 °[employer]® Patient Information ©2015 [employer]. This information is not intended to replace advice given to you by your health care provider. Make sure you discuss any questions you have with your health care provider. ° °
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
## 2018-02-03 ENCOUNTER — Emergency Department (HOSPITAL_COMMUNITY): Payer: Medicare Other

## 2018-02-03 ENCOUNTER — Observation Stay (HOSPITAL_COMMUNITY)
Admission: EM | Admit: 2018-02-03 | Discharge: 2018-02-05 | Disposition: A | Discharge status: Home / Self Care | Payer: Medicare Other | Attending: Internal Medicine | Admitting: Internal Medicine

## 2018-02-03 ENCOUNTER — Encounter (HOSPITAL_COMMUNITY): Payer: Self-pay | Admitting: Neurology

## 2018-02-03 DIAGNOSIS — E876 Hypokalemia: Secondary | ICD-10-CM | POA: Diagnosis not present

## 2018-02-03 DIAGNOSIS — G92 Toxic encephalopathy: Secondary | ICD-10-CM | POA: Diagnosis not present

## 2018-02-03 DIAGNOSIS — H60501 Unspecified acute noninfective otitis externa, right ear: Secondary | ICD-10-CM | POA: Diagnosis not present

## 2018-02-03 DIAGNOSIS — R4182 Altered mental status, unspecified: Secondary | ICD-10-CM | POA: Diagnosis present

## 2018-02-03 DIAGNOSIS — R52 Pain, unspecified: Secondary | ICD-10-CM | POA: Diagnosis not present

## 2018-02-03 DIAGNOSIS — N289 Disorder of kidney and ureter, unspecified: Secondary | ICD-10-CM | POA: Diagnosis not present

## 2018-02-03 DIAGNOSIS — N390 Urinary tract infection, site not specified: Secondary | ICD-10-CM | POA: Insufficient documentation

## 2018-02-03 DIAGNOSIS — T6594XA Toxic effect of unspecified substance, undetermined, initial encounter: Secondary | ICD-10-CM | POA: Insufficient documentation

## 2018-02-03 DIAGNOSIS — G9340 Encephalopathy, unspecified: Secondary | ICD-10-CM | POA: Diagnosis present

## 2018-02-03 DIAGNOSIS — F1910 Other psychoactive substance abuse, uncomplicated: Secondary | ICD-10-CM | POA: Diagnosis present

## 2018-02-03 DIAGNOSIS — R9431 Abnormal electrocardiogram [ECG] [EKG]: Secondary | ICD-10-CM | POA: Diagnosis present

## 2018-02-03 DIAGNOSIS — G928 Other toxic encephalopathy: Secondary | ICD-10-CM

## 2018-02-03 DIAGNOSIS — H7290 Unspecified perforation of tympanic membrane, unspecified ear: Secondary | ICD-10-CM | POA: Insufficient documentation

## 2018-02-03 LAB — I-STAT CHEM 8, ED
BUN: 13 mg/dL (ref 6–20)
CALCIUM ION: 1.01 mmol/L — AB (ref 1.15–1.40)
CHLORIDE: 103 mmol/L (ref 101–111)
Creatinine, Ser: 1.2 mg/dL — ABNORMAL HIGH (ref 0.44–1.00)
Glucose, Bld: 131 mg/dL — ABNORMAL HIGH (ref 65–99)
HEMATOCRIT: 32 % — AB (ref 36.0–46.0)
Hemoglobin: 10.9 g/dL — ABNORMAL LOW (ref 12.0–15.0)
POTASSIUM: 3 mmol/L — AB (ref 3.5–5.1)
SODIUM: 140 mmol/L (ref 135–145)
TCO2: 24 mmol/L (ref 22–32)

## 2018-02-03 LAB — RAPID URINE DRUG SCREEN, HOSP PERFORMED
AMPHETAMINES: POSITIVE — AB
BENZODIAZEPINES: NOT DETECTED
Cocaine: POSITIVE — AB
Opiates: NOT DETECTED
Tetrahydrocannabinol: NOT DETECTED

## 2018-02-03 LAB — DIFFERENTIAL
ABS IMMATURE GRANULOCYTES: 0.1 10*3/uL (ref 0.0–0.1)
BASOS ABS: 0 10*3/uL (ref 0.0–0.1)
BASOS PCT: 0 %
Eosinophils Absolute: 0.1 10*3/uL (ref 0.0–0.7)
Eosinophils Relative: 1 %
Immature Granulocytes: 1 %
Lymphocytes Relative: 14 %
Lymphs Abs: 1 10*3/uL (ref 0.7–4.0)
Monocytes Absolute: 0.3 10*3/uL (ref 0.1–1.0)
Monocytes Relative: 4 %
NEUTROS ABS: 5.6 10*3/uL (ref 1.7–7.7)
NEUTROS PCT: 80 %

## 2018-02-03 LAB — COMPREHENSIVE METABOLIC PANEL
ALBUMIN: 3.5 g/dL (ref 3.5–5.0)
ALT: 35 U/L (ref 14–54)
AST: 52 U/L — AB (ref 15–41)
Alkaline Phosphatase: 58 U/L (ref 38–126)
Anion gap: 8 (ref 5–15)
BUN: 12 mg/dL (ref 6–20)
CHLORIDE: 103 mmol/L (ref 101–111)
CO2: 27 mmol/L (ref 22–32)
CREATININE: 1.3 mg/dL — AB (ref 0.44–1.00)
Calcium: 8.5 mg/dL — ABNORMAL LOW (ref 8.9–10.3)
GFR calc Af Amer: 53 mL/min — ABNORMAL LOW (ref >60)
GFR calc non Af Amer: 45 mL/min — ABNORMAL LOW (ref >60)
GLUCOSE: 134 mg/dL — AB (ref 65–99)
Potassium: 3 mmol/L — ABNORMAL LOW (ref 3.5–5.1)
SODIUM: 138 mmol/L (ref 135–145)
Total Bilirubin: 1 mg/dL (ref 0.3–1.2)
Total Protein: 6.9 g/dL (ref 6.5–8.1)

## 2018-02-03 LAB — I-STAT TROPONIN, ED: Troponin i, poc: 0.01 ng/mL (ref 0.00–0.08)

## 2018-02-03 LAB — URINALYSIS, ROUTINE W REFLEX MICROSCOPIC
Bilirubin Urine: NEGATIVE
GLUCOSE, UA: NEGATIVE mg/dL
KETONES UR: NEGATIVE mg/dL
LEUKOCYTES UA: NEGATIVE
NITRITE: POSITIVE — AB
PH: 5 (ref 5.0–8.0)
Protein, ur: 30 mg/dL — AB
Specific Gravity, Urine: 1.023 (ref 1.005–1.030)

## 2018-02-03 LAB — CBC
HEMATOCRIT: 35.1 % — AB (ref 36.0–46.0)
HEMOGLOBIN: 11 g/dL — AB (ref 12.0–15.0)
MCH: 30.3 pg (ref 26.0–34.0)
MCHC: 31.3 g/dL (ref 30.0–36.0)
MCV: 96.7 fL (ref 78.0–100.0)
Platelets: 230 10*3/uL (ref 150–400)
RBC: 3.63 MIL/uL — ABNORMAL LOW (ref 3.87–5.11)
RDW: 14.6 % (ref 11.5–15.5)
WBC: 7 10*3/uL (ref 4.0–10.5)

## 2018-02-03 LAB — ETHANOL

## 2018-02-03 LAB — APTT: APTT: 31 s (ref 24–36)

## 2018-02-03 LAB — CBG MONITORING, ED: Glucose-Capillary: 133 mg/dL — ABNORMAL HIGH (ref 65–99)

## 2018-02-03 LAB — PROTIME-INR
INR: 0.98
Prothrombin Time: 12.9 seconds (ref 11.4–15.2)

## 2018-02-03 LAB — I-STAT CG4 LACTIC ACID, ED: Lactic Acid, Venous: 1.67 mmol/L (ref 0.5–1.9)

## 2018-02-03 MED ORDER — POTASSIUM CHLORIDE CRYS ER 20 MEQ PO TBCR
20.0000 meq | EXTENDED_RELEASE_TABLET | Freq: Once | ORAL | Status: DC
  Filled 2018-02-03: qty 1

## 2018-02-03 MED ORDER — MAGNESIUM SULFATE 2 GM/50ML IV SOLN
2.0000 g | Freq: Once | INTRAVENOUS | Status: AC
  Administered 2018-02-04: 2 g via INTRAVENOUS
  Filled 2018-02-03: qty 50

## 2018-02-03 MED ORDER — SODIUM CHLORIDE 0.9 % IV SOLN
1.0000 g | Freq: Once | INTRAVENOUS | Status: AC
  Administered 2018-02-03: 1 g via INTRAVENOUS
  Filled 2018-02-03: qty 10

## 2018-02-03 MED ORDER — ONDANSETRON HCL 4 MG/2ML IJ SOLN: 4.0000 mg | Freq: Four times a day (QID) | INTRAMUSCULAR | Status: DC | PRN

## 2018-02-03 MED ORDER — SODIUM CHLORIDE 0.9 % IV SOLN
1.0000 g | INTRAVENOUS | Status: DC
  Administered 2018-02-04: 1 g via INTRAVENOUS
  Filled 2018-02-03 (×2): qty 10

## 2018-02-03 MED ORDER — AMOXICILLIN-POT CLAVULANATE 875-125 MG PO TABS
1.0000 | ORAL_TABLET | Freq: Once | ORAL | Status: AC
  Administered 2018-02-03: 1 via ORAL
  Filled 2018-02-03: qty 1

## 2018-02-03 MED ORDER — ACETAMINOPHEN 650 MG RE SUPP: 650.0000 mg | Freq: Four times a day (QID) | RECTAL | Status: DC | PRN

## 2018-02-03 MED ORDER — LORAZEPAM 2 MG/ML IJ SOLN
1.0000 mg | Freq: Once | INTRAMUSCULAR | Status: AC
  Administered 2018-02-03: 1 mg via INTRAVENOUS
  Filled 2018-02-03: qty 1

## 2018-02-03 MED ORDER — BISACODYL 5 MG PO TBEC: 5.0000 mg | DELAYED_RELEASE_TABLET | Freq: Every day | ORAL | Status: DC | PRN

## 2018-02-03 MED ORDER — ACETAMINOPHEN 325 MG PO TABS
650.0000 mg | ORAL_TABLET | Freq: Four times a day (QID) | ORAL | Status: DC | PRN
  Administered 2018-02-04: 650 mg via ORAL
  Filled 2018-02-03: qty 2

## 2018-02-03 MED ORDER — POTASSIUM CHLORIDE IN NACL 40-0.9 MEQ/L-% IV SOLN
INTRAVENOUS | Status: DC
  Administered 2018-02-04: 100 mL/h via INTRAVENOUS
  Filled 2018-02-03: qty 1000

## 2018-02-03 MED ORDER — SENNOSIDES-DOCUSATE SODIUM 8.6-50 MG PO TABS: 1.0000 | ORAL_TABLET | Freq: Every evening | ORAL | Status: DC | PRN

## 2018-02-03 MED ORDER — ENOXAPARIN SODIUM 40 MG/0.4ML ~~LOC~~ SOLN
40.0000 mg | SUBCUTANEOUS | Status: DC
  Administered 2018-02-04 – 2018-02-05 (×2): 40 mg via SUBCUTANEOUS
  Filled 2018-02-03 (×2): qty 0.4

## 2018-02-03 MED ORDER — TRAMADOL HCL 50 MG PO TABS: 50.0000 mg | ORAL_TABLET | Freq: Four times a day (QID) | ORAL | Status: DC | PRN

## 2018-02-03 MED ORDER — SODIUM CHLORIDE 0.9% FLUSH
3.0000 mL | Freq: Two times a day (BID) | INTRAVENOUS | Status: DC
  Administered 2018-02-04 – 2018-02-05 (×4): 3 mL via INTRAVENOUS

## 2018-02-03 MED ORDER — ONDANSETRON HCL 4 MG PO TABS: 4.0000 mg | ORAL_TABLET | Freq: Four times a day (QID) | ORAL | Status: DC | PRN

## 2018-02-03 MED ORDER — SODIUM CHLORIDE 0.9 % IV BOLUS
1000.0000 mL | Freq: Once | INTRAVENOUS | Status: AC
  Administered 2018-02-03: 1000 mL via INTRAVENOUS

## 2018-02-03 NOTE — ED Notes (Signed)
Pt placed on bedpan to provide urine sample  

## 2018-02-03 NOTE — ED Notes (Signed)
Pt is back from MRI.
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
## 2018-02-03 NOTE — ED Triage Notes (Addendum)
[facility] home where she lives with her daughter and grandchilden.  Pt went to lay down at 1:30pm, she was at baseline at the time and when her daughter went to try to wake her at 3pm she was not very responsive.  Pt arrives to ED and is seen by EDP, neurology (MD and PA) as well as stroke RN and taken to CT>  Upon moving pt from stretcher to CT table pt suddenly has a rapid right-left movement of bilateral eyes.  She is more alert and asks "why are you spinning me?".  Pt is intially not verbally responsive.  No focal weakness noted.  Pt is quite strong in upper extremities and drewup her arms making it difficult to get her undressed. Pt is more responsive after CT and tells us that she is having headache, ear pain and that it hurts to speak.  Pt appears to be alert and oriented without any focal weakness or deficit at this time. Pt is screaming at us and ripped off her leads after MD left

## 2018-02-03 NOTE — ED Notes (Signed)
Pt ambulatory to the restroom with 2 staff assist 

## 2018-02-03 NOTE — ED Notes (Signed)
Pt resting, nad
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
## 2018-02-03 NOTE — ED Notes (Signed)
Pt's CBG result was 133. Informed Alvino ChapelEllen - RN.
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
## 2018-02-03 NOTE — ED Notes (Signed)
Came to check on pt in MRI.  HR 83, sop2 is 97%.  Pt is sedated from Ativan but arousable.  Pt is not a reliable historian for MRI safety.

## 2018-02-03 NOTE — Code Documentation (Signed)
58 yo female coming from home where her family complained that patient had gone to bed normal at 1330, but when woken up at 1500, pt would nmot speak and was not acting herself. EMS was called and EMS activated a Code Stroke. Stroke Team met patient at the door and evaluated the patient. Initial NIHSS 15 due to patient whispering wrong answers to questions and patient not following commands. When assessing limbs, pt would let all limbs fall to the bed while assessing, but right arm was held up to ear in CT with no problem. Left arm showed no sign of drift. CT negative and showed no bleed. Patient not showing any specific focal deficit. Generally weak all over and answering questions in a whispering voice or with nodding. Complains of bilateral ear pain and frontal neck pain. Denies any medical history besides left hip surgery. Takes no medication. No history of seizure or stroke. No tPA due to MD not suspecting a stroke. Handoff given to Dorian Pod, Therapist, sports. Code Stroke cancelled.

## 2018-02-03 NOTE — ED Notes (Signed)
Patient transported to MRI 

## 2018-02-04 ENCOUNTER — Other Ambulatory Visit: Payer: Self-pay

## 2018-02-04 ENCOUNTER — Encounter (HOSPITAL_COMMUNITY): Payer: Self-pay | Admitting: General Practice

## 2018-02-04 DIAGNOSIS — H66011 Acute suppurative otitis media with spontaneous rupture of ear drum, right ear: Secondary | ICD-10-CM | POA: Diagnosis not present

## 2018-02-04 DIAGNOSIS — R9431 Abnormal electrocardiogram [ECG] [EKG]: Secondary | ICD-10-CM | POA: Diagnosis present

## 2018-02-04 DIAGNOSIS — H7290 Unspecified perforation of tympanic membrane, unspecified ear: Secondary | ICD-10-CM

## 2018-02-04 DIAGNOSIS — T6594XA Toxic effect of unspecified substance, undetermined, initial encounter: Secondary | ICD-10-CM | POA: Diagnosis not present

## 2018-02-04 DIAGNOSIS — H7293 Unspecified perforation of tympanic membrane, bilateral: Secondary | ICD-10-CM | POA: Diagnosis not present

## 2018-02-04 DIAGNOSIS — H60501 Unspecified acute noninfective otitis externa, right ear: Secondary | ICD-10-CM | POA: Diagnosis not present

## 2018-02-04 DIAGNOSIS — G928 Other toxic encephalopathy: Secondary | ICD-10-CM

## 2018-02-04 DIAGNOSIS — F1910 Other psychoactive substance abuse, uncomplicated: Secondary | ICD-10-CM | POA: Diagnosis not present

## 2018-02-04 DIAGNOSIS — G92 Toxic encephalopathy: Secondary | ICD-10-CM

## 2018-02-04 LAB — CBC WITH DIFFERENTIAL/PLATELET
Abs Immature Granulocytes: 0 10*3/uL (ref 0.0–0.1)
BASOS PCT: 1 %
Basophils Absolute: 0 10*3/uL (ref 0.0–0.1)
EOS ABS: 0.1 10*3/uL (ref 0.0–0.7)
Eosinophils Relative: 2 %
HCT: 33.7 % — ABNORMAL LOW (ref 36.0–46.0)
Hemoglobin: 10.5 g/dL — ABNORMAL LOW (ref 12.0–15.0)
IMMATURE GRANULOCYTES: 1 %
Lymphocytes Relative: 17 %
Lymphs Abs: 1 10*3/uL (ref 0.7–4.0)
MCH: 30.3 pg (ref 26.0–34.0)
MCHC: 31.2 g/dL (ref 30.0–36.0)
MCV: 97.4 fL (ref 78.0–100.0)
MONOS PCT: 4 %
Monocytes Absolute: 0.3 10*3/uL (ref 0.1–1.0)
NEUTROS PCT: 75 %
Neutro Abs: 4.6 10*3/uL (ref 1.7–7.7)
PLATELETS: 231 10*3/uL (ref 150–400)
RBC: 3.46 MIL/uL — AB (ref 3.87–5.11)
RDW: 14.6 % (ref 11.5–15.5)
WBC: 6 10*3/uL (ref 4.0–10.5)

## 2018-02-04 LAB — BASIC METABOLIC PANEL
ANION GAP: 10 (ref 5–15)
BUN: 9 mg/dL (ref 6–20)
CALCIUM: 8.3 mg/dL — AB (ref 8.9–10.3)
CO2: 25 mmol/L (ref 22–32)
Chloride: 104 mmol/L (ref 101–111)
Creatinine, Ser: 1.07 mg/dL — ABNORMAL HIGH (ref 0.44–1.00)
GFR, EST NON AFRICAN AMERICAN: 57 mL/min — AB (ref >60)
Glucose, Bld: 91 mg/dL (ref 65–99)
Potassium: 2.9 mmol/L — ABNORMAL LOW (ref 3.5–5.1)
SODIUM: 139 mmol/L (ref 135–145)

## 2018-02-04 LAB — MAGNESIUM: Magnesium: 2 mg/dL (ref 1.7–2.4)

## 2018-02-04 LAB — VITAMIN B12: VITAMIN B 12: 506 pg/mL (ref 180–914)

## 2018-02-04 LAB — AMMONIA: Ammonia: 46 umol/L — ABNORMAL HIGH (ref 9–35)

## 2018-02-04 LAB — LACTIC ACID, PLASMA: Lactic Acid, Venous: 1.4 mmol/L (ref 0.5–1.9)

## 2018-02-04 LAB — GLUCOSE, CAPILLARY: Glucose-Capillary: 106 mg/dL — ABNORMAL HIGH (ref 65–99)

## 2018-02-04 LAB — RPR: RPR Ser Ql: NONREACTIVE

## 2018-02-04 LAB — TSH: TSH: 246.485 u[IU]/mL — ABNORMAL HIGH (ref 0.350–4.500)

## 2018-02-04 LAB — HIV ANTIBODY (ROUTINE TESTING W REFLEX): HIV Screen 4th Generation wRfx: NONREACTIVE

## 2018-02-04 MED ORDER — HYDROCODONE-ACETAMINOPHEN 5-325 MG PO TABS
2.0000 | ORAL_TABLET | Freq: Once | ORAL | Status: AC
  Administered 2018-02-04: 2 via ORAL
  Filled 2018-02-04: qty 2

## 2018-02-04 MED ORDER — HYDROCODONE-ACETAMINOPHEN 5-325 MG PO TABS
1.0000 | ORAL_TABLET | ORAL | Status: DC | PRN
  Administered 2018-02-04 – 2018-02-05 (×6): 2 via ORAL
  Filled 2018-02-04: qty 1
  Filled 2018-02-04 (×4): qty 2
  Filled 2018-02-04: qty 1
  Filled 2018-02-04: qty 2

## 2018-02-04 MED ORDER — LEVOTHYROXINE SODIUM 25 MCG PO TABS
25.0000 ug | ORAL_TABLET | Freq: Every day | ORAL | Status: DC
  Administered 2018-02-05: 25 ug via ORAL
  Filled 2018-02-04: qty 1

## 2018-02-04 NOTE — Progress Notes (Addendum)
Patient arrived on floor around 0200 she is being admitted for observation and antibiotic therapy for ear and urinary tract infection,earlier today she was confused, CT and MRI were done both were negative for acute abnormality, she is improving and is responding to question appropriatly. She has bilateral ear infection and knee and hip pain form chronic orthopedic issues. Will continue to monitor.

## 2018-02-04 NOTE — Progress Notes (Signed)
PROGRESS NOTE  [name] BJY:[id number] DOB: December 16, 1960 DOA: 02/03/2018 PCP: Patient, No Pcp Per  Brief Narrative: 57 year old woman lives with her daughter presented after being found unresponsive, aphasic and not following commands.  CT head negative.  Initial exam nonfocal.  Noted to be confused.  Reported left leg pain, ear pain, headache.  MRI brain negative for acute infarct.  Neurology impression acute encephalopathy.  Neurology signed off.  Assessment/Plan Toxic metabolic encephalopathy, multifactorial: Urine drug screen positive for cocaine and amphetamines UTI, sinusitis, otitis media.  Head CT negative, MRI brain negative for acute findings.  Patient refused LP.  No fever, leukocytosis or meningismus. --resolved. --counseled to abstain from illegal drugs  Right AOM, perforated TM, acute sinusitis, UTI. --Empiric ceftriaxone.  Follow-up culture data. --Change to Augmentin on discharge.  Water precautions, do not submerge. --Follow-up with ENT has been arranged  AKI.  Secondary to vomiting. --Appears resolved.  Normocytic anemia --Follow-up as an outpatient  COPD --Recommend stop smoking, patient not ready to quit  Polysubstance abuse.  Strongly recommend discontinuing cocaine, amphetamines.  Hypothyroidism.  TSH 246.485. --Patient off medications for at least one year --Case management arranging outpatient follow-up, medication assistance if indicated --Initiate levothyroxine  Prolonged QT --Repeat EKG  Chronic hip and knee pain   DVT prophylaxis: enoxaparin Code Status: Full Family Communication: none Disposition Plan: home    Brendia Sacks, MD  [facility] Direct contact: [phone] --Via amion app OR  --[email or web address]; password [email or web address]  7PM-7AM contact night coverage as above 02/04/2018, 3:35 PM  LOS: 0 days   Consultants:    Procedures:    Antimicrobials:  Ceftriaxone 6/17 >>  Interval history/Subjective: C/o severe right  pain, with ear drainage. Decreased hearing. No urinary symptoms.   Objective: Vitals:  Vitals:   02/04/18 0829 02/04/18 1254  BP: 126/84 114/89  Pulse: 73 71  Resp: 16 17  Temp: 98 F (36.7 C) 97.6 F (36.4 C)  SpO2: 94% 97%    Exam:  Constitutional:  . Appears calm, uncomfortable ENMT:  . Decreased hearing . external ears appear normal . Right ear: perforated tympanic membrane, inflammed, edematous with exudate . Left ear: perforated TM, non-inflammed, no drainage noted Neck:  . Thyromegaly noted Respiratory:  . CTA bilaterally, no w/r/r.  . Respiratory effort normal.  Cardiovascular:  . RRR, no m/r/g Psychiatric:  . Mental status o Mood, affect appropriate  I have personally reviewed the following:   Labs:  K+ 2.9  Mg 2.0  Hgb 10.5  UDS noted  Imaging studies:  MRI brain noted  Medical tests:  EKG sinus rhythm, no acute changes.   Scheduled Meds: . enoxaparin (LOVENOX) injection  40 mg Subcutaneous Q24H  . [START ON 02/05/2018] levothyroxine  25 mcg Oral QAC breakfast  . potassium chloride  20 mEq Oral Once  . sodium chloride flush  3 mL Intravenous Q12H   Continuous Infusions: . cefTRIAXone (ROCEPHIN)  IV      Principal Problem:   Toxic metabolic encephalopathy Active Problems:   Acute otitis externa of right ear   Acute lower UTI   Polysubstance abuse (HCC)   Prolonged QT interval   Perforated tympanic membrane   LOS: 0 days

## 2018-02-04 NOTE — Care Management Note (Addendum)
Case Management Note  Patient Details  Name: [name] MRN: [id number] Date of Birth: 08-Aug-1961  Subjective/Objective:      Pt admitted with acute encephalopathy. She is from home with her daughter. Pt without insurance listed and no PCP.             Action/Plan: Pt states she has Medicare. CM notified financial counseling. Pt also called family to see if they can find card and bring to hospital. Pt doesn't have PCP. CM waiting to see if pt has Medicare so PCP can be established. CM following for d/c needs.   Addendum: financial counseling was able to find her Medicare information. CM was able to obtain her an appt with a PCP on Friday. Information on the AVS. Pt unsure about medication coverage through her medicare. CM will follow tomorrow for potential medication assistance.  Expected Discharge Date:                  Expected Discharge Plan:  Home/Self Care  In-House Referral:     Discharge planning Services  CM Consult  Post Acute Care Choice:    Choice offered to:     DME Arranged:    DME Agency:     HH Arranged:    HH Agency:     Status of Service:  In process, will continue to follow  If discussed at Long Length of Stay Meetings, dates discussed:    Additional Comments:  Kermit BaloKelli F Halcyon Heck, RN 02/04/2018, 2:30 PM

## 2018-02-05 DIAGNOSIS — G92 Toxic encephalopathy: Secondary | ICD-10-CM | POA: Diagnosis not present

## 2018-02-05 DIAGNOSIS — H60501 Unspecified acute noninfective otitis externa, right ear: Secondary | ICD-10-CM | POA: Diagnosis not present

## 2018-02-05 DIAGNOSIS — H60391 Other infective otitis externa, right ear: Secondary | ICD-10-CM

## 2018-02-05 DIAGNOSIS — F1910 Other psychoactive substance abuse, uncomplicated: Secondary | ICD-10-CM | POA: Diagnosis not present

## 2018-02-05 DIAGNOSIS — H7293 Unspecified perforation of tympanic membrane, bilateral: Secondary | ICD-10-CM | POA: Diagnosis not present

## 2018-02-05 DIAGNOSIS — T6594XA Toxic effect of unspecified substance, undetermined, initial encounter: Secondary | ICD-10-CM | POA: Diagnosis not present

## 2018-02-05 DIAGNOSIS — N289 Disorder of kidney and ureter, unspecified: Secondary | ICD-10-CM | POA: Diagnosis not present

## 2018-02-05 LAB — FOLATE RBC
FOLATE, HEMOLYSATE: 225.6 ng/mL
FOLATE, RBC: 1021 ng/mL (ref >498)
HEMATOCRIT: 22.1 % — AB (ref 34.0–46.6)

## 2018-02-05 LAB — BASIC METABOLIC PANEL
ANION GAP: 6 (ref 5–15)
BUN: 6 mg/dL (ref 6–20)
CALCIUM: 8.9 mg/dL (ref 8.9–10.3)
CO2: 28 mmol/L (ref 22–32)
Chloride: 105 mmol/L (ref 101–111)
Creatinine, Ser: 0.93 mg/dL (ref 0.44–1.00)
GFR calc Af Amer: >60 mL/min (ref >60)
GFR calc non Af Amer: >60 mL/min (ref >60)
GLUCOSE: 99 mg/dL (ref 65–99)
Potassium: 3.4 mmol/L — ABNORMAL LOW (ref 3.5–5.1)
Sodium: 139 mmol/L (ref 135–145)

## 2018-02-05 LAB — URINE CULTURE: CULTURE: NO GROWTH

## 2018-02-05 LAB — GLUCOSE, CAPILLARY: Glucose-Capillary: 85 mg/dL (ref 65–99)

## 2018-02-05 LAB — MAGNESIUM: Magnesium: 2 mg/dL (ref 1.7–2.4)

## 2018-02-05 MED ORDER — POTASSIUM CHLORIDE CRYS ER 20 MEQ PO TBCR
40.0000 meq | EXTENDED_RELEASE_TABLET | Freq: Once | ORAL | Status: AC
  Administered 2018-02-05: 40 meq via ORAL
  Filled 2018-02-05: qty 2

## 2018-02-05 MED ORDER — AMOXICILLIN-POT CLAVULANATE 875-125 MG PO TABS: 1.0000 | ORAL_TABLET | Freq: Two times a day (BID) | ORAL | 0 refills | Status: DC

## 2018-02-05 MED ORDER — LEVOTHYROXINE SODIUM 50 MCG PO TABS: 50.0000 ug | ORAL_TABLET | Freq: Every day | ORAL | 0 refills | Status: DC

## 2018-02-05 NOTE — Care Management Obs Status (Signed)
MEDICARE OBSERVATION STATUS NOTIFICATION   Patient Details  Name: [name] MRN: [id number] Date of Birth: January 22, 1961   Medicare Observation Status Notification Given:  Yes    Kermit BaloKelli F Gissela Bloch, RN 02/05/2018, 1:18 PM

## 2018-02-05 NOTE — Discharge Summary (Signed)
PATIENT DETAILS Name: [name] Age: 57 y.o. Sex: female Date of Birth: Sep 15, 1960 MRN: [id number]. Admitting Physician: Briscoe Deutscher, MD NWG:[id number], No Pcp Per  Admit Date: 02/03/2018 Discharge date: 02/05/2018  Recommendations for Outpatient Follow-up:  1. Follow up with PCP in 1-2 weeks 2. Please obtain BMP/CBC in one week 3. Continue counseling regarding avoidance of polysubstance use 4. Recheck TSH in 3 months-noncompliant to medications-TSH more than 200 during this hospital stay. 5. Recheck twelve-lead EKG at next visit with PCP 6. And she will follow-up with ENT-may benefit from a audiogram  Admitted From:  Home  Disposition: Home  Home Health: No  Equipment/Devices: None  Discharge Condition: Stable  CODE STATUS: FULL CODE  Diet recommendation:  Regular   Brief Summary: See H&P, Labs, Consult and Test reports for all details in brief, patient is a 57 year old female with prior history of hypothyroidism, medication noncompliance, polysubstance use-presented to the hospital for evaluation of confusion/please responsiveness-thought to have toxic metabolic encephalopathy and subsequently admitted to the hospitalist service.  See below for further details.    Brief Hospital Course: Acute toxic/metabolic encephalopathy: Multifactorial etiology-suspect secondary to cocaine/amphetamine use-probably otitis media also playing a role.  Head CT/MRI brain negative for acute abnormalities.  Patient refused a lumbar puncture.  Seen by neurology in the emergency room with no further work-up recommended.  By day of discharge, completely awake and alert without any major issues.    Right acute otitis media/perforated tympanic membrane: Treated with empiric ceftriaxone-change to Augmentin on discharge.  Water precautions, do not submerge  Hypothyroidism: TSH profoundly elevated at 246-unfortunately she has been off medications for more than a year-restart levothyroxine  50 mcg on discharge.  Thankfully she does not appear to have symptoms consistent with myxedema.  Follow-up with PCP-needs a repeat TSH in 3 months.  Mildly prolonged QTc: Suspect secondary to hypokalemia-electrolytes repleted-please repeat EKG at follow-up with PCP.  Still persistently prolonged-may require outpatient follow-up cardiology..  Acute kidney injury: Likely hemodynamically mediated-resolved.  Normocytic anemia: Stable for outpatient follow-up.  COPD: No acute issues-recommended that she stop smoking-do not think she is ready at this time.  Follow with PCP.  Polysubstance abuse: Strongly recommended that she discontinue use of cocaine, amphetamines and other illicit agents.  HIV negative.  Procedures/Studies: None  Discharge Diagnoses:  Principal Problem:   Toxic metabolic encephalopathy Active Problems:   Acute otitis externa of right ear   Acute lower UTI   Polysubstance abuse (HCC)   Prolonged QT interval   Perforated tympanic membrane   Discharge Instructions:  Activity:  As tolerated with Full fall precautions use walker/cane & assistance as needed  Discharge Instructions    Call MD for:  persistant nausea and vomiting   Complete by:  As directed    Call MD for:  temperature >100.4   Complete by:  As directed    Diet - low sodium heart healthy   Complete by:  As directed    Discharge instructions   Complete by:  As directed    Follow with Primary MD in 1 week  Follow with ENT-please see discharge instructions.  Avoid illicit drug use like cocaine marijuana heroin etc  Please ask your PCP to recheck twelve-lead EKG at your next visit.  Please ask your PCP recheck TSH in 3 months.  Please get a complete blood count and chemistry panel checked by your Primary MD at your next visit, and again as instructed by your Primary MD.  Get Medicines reviewed and  adjusted: Please take all your medications with you for your next visit with your Primary  MD  Laboratory/radiological data: Please request your Primary MD to go over all hospital tests and procedure/radiological results at the follow up, please ask your Primary MD to get all Hospital records sent to his/her office.  In some cases, they will be blood work, cultures and biopsy results pending at the time of your discharge. Please request that your primary care M.D. follows up on these results.  Also Note the following: If you experience worsening of your admission symptoms, develop shortness of breath, life threatening emergency, suicidal or homicidal thoughts you must seek medical attention immediately by calling 911 or calling your MD immediately  if symptoms less severe.  You must read complete instructions/literature along with all the possible adverse reactions/side effects for all the Medicines you take and that have been prescribed to you. Take any new Medicines after you have completely understood and accpet all the possible adverse reactions/side effects.   Do not drive when taking Pain medications or sleeping medications (Benzodaizepines)  Do not take more than prescribed Pain, Sleep and Anxiety Medications. It is not advisable to combine anxiety,sleep and pain medications without talking with your primary care practitioner  Special Instructions: If you have smoked or chewed Tobacco  in the last 2 yrs please stop smoking, stop any regular Alcohol  and or any Recreational drug use.  Wear Seat belts while driving.  Please note: You were cared for by a hospitalist during your hospital stay. Once you are discharged, your primary care physician will handle any further medical issues. Please note that NO REFILLS for any discharge medications will be authorized once you are discharged, as it is imperative that you return to your primary care physician (or establish a relationship with a primary care physician if you do not have one) for your post hospital discharge needs so that  they can reassess your need for medications and monitor your lab values.   Increase activity slowly   Complete by:  As directed      Allergies as of 02/05/2018      Reactions   Darvon [propoxyphene] Hives, Itching, Rash   Percocet [oxycodone-acetaminophen] Hives, Itching, Rash   *Darvocet*      Medication List    TAKE these medications   amoxicillin-clavulanate 875-125 MG tablet Commonly known as:  AUGMENTIN Take 1 tablet by mouth 2 (two) times daily.   levothyroxine 50 MCG tablet Commonly known as:  SYNTHROID, LEVOTHROID Take 1 tablet (50 mcg total) by mouth daily before breakfast. Start taking on:  02/06/2018      Follow-up Information    [place] [facility] Follow up on 02/07/2018.   Why:  Your appointment time is 1:30 pm. Please arrive 15 min early and bring your insurance information and your current medications.  Contact information: [address] [place], [place] [address] [phone]   Open  Closes 5PM       [place], [place] Follow up on 02/13/2018.   Specialty:  Physician Assistant Why:  1:40 PM Contact information: [address] [place] [address] [phone]          Allergies  Allergen Reactions  . Darvon [Propoxyphene] Hives, Itching and Rash  . Percocet [Oxycodone-Acetaminophen] Hives, Itching and Rash    *Darvocet*    Consultations:  Neurology     Other Procedures/Studies: Dg Chest 1 View  Result Date: 02/03/2018 CLINICAL DATA:  57 year old female code stroke patient. Screening for metal. EXAM: CHEST  1 VIEW COMPARISON:  Chest radiographs 12/10/2010. FINDINGS: Portable AP semi upright view at 1749 hours. External clothing snaps and EKG leads. No radiopaque foreign body identified. Patchy and indistinct peripheral opacity in the right upper lung (arrow). Stable lung volumes. Mildly increased cardiac size. Other mediastinal contours are within normal limits. Visualized  tracheal air column is within normal limits. No other confluent pulmonary opacity. No pneumothorax or pleural effusion. Negative visible bowel gas pattern. No acute osseous abnormality identified. IMPRESSION: 1. Indistinct increased peripheral right lung opacity. Consider aspiration in this clinical setting. 2. No chest radiopaque retained foreign body identified to contraindicate MRI. Electronically Signed   By: Odessa Fleming M.D.   On: 02/03/2018 18:00   Mr Brain Wo Contrast  Result Date: 02/03/2018 CLINICAL DATA:  57 year old female with weakness and aphasia today. EXAM: MRI HEAD WITHOUT CONTRAST TECHNIQUE: Multiplanar, multiecho pulse sequences of the brain and surrounding structures were obtained without intravenous contrast. COMPARISON:  Head CT without contrast 1538 hours today, and earlier. FINDINGS: Brain: Study is intermittently degraded by motion artifact despite repeated imaging attempts. No restricted diffusion to suggest acute infarction. No midline shift, mass effect, evidence of mass lesion, ventriculomegaly, extra-axial collection or acute intracranial hemorrhage. Cervicomedullary junction and pituitary are within normal limits. No definite cortical encephalomalacia or chronic cerebral blood products. Mild for age nonspecific scattered cerebral white matter T2 and FLAIR hyperintensity. The deep gray matter nuclei, brainstem, and cerebellum appear normal. Vascular: Major intracranial vascular flow voids are preserved, the distal left vertebral artery appears dominant. Skull and upper cervical spine: Negative visible cervical spine. Normal bone marrow signal. Sinuses/Orbits: Orbit motion artifact, grossly normal orbits soft tissues. Stable ethmoid and right maxillary sinus mucosal thickening. Other: Bilateral mastoid air cell effusions and/or sclerosis. Scalp and face soft tissues appear negative. IMPRESSION: 1. No acute intracranial abnormality identified on this intermittently motion degraded study.  2. Mild for age nonspecific cerebral white matter signal changes. 3. Acute or chronic paranasal sinus and mastoid inflammation as seen on CT today. Electronically Signed   By: Odessa Fleming M.D.   On: 02/03/2018 18:49   Ct Head Code Stroke Wo Contrast  Result Date: 02/03/2018 CLINICAL DATA:  Code stroke.  Weakness and aphasia EXAM: CT HEAD WITHOUT CONTRAST TECHNIQUE: Contiguous axial images were obtained from the base of the skull through the vertex without intravenous contrast. COMPARISON:  12/10/2010 FINDINGS: Brain: No evidence of acute infarction, hemorrhage, hydrocephalus, extra-axial collection or mass lesion/mass effect. Vascular: No hyperdense vessel or unexpected calcification. Skull: Dystrophic appearing calcification in the right forehead soft tissues. Sinuses/Orbits: Kozel thickening in the paranasal sinuses including a right maxillary fluid level. Partial opacification of right mastoid and middle ear spaces. Other: These results were communicated to at 3:43 pmon 6/17/2019by text page via the [facility] messaging system. ASPECTS [facility] Stroke Program Early CT Score) - Ganglionic level infarction (caudate, lentiform nuclei, internal capsule, insula, M1-M3 cortex): 7 - Supraganglionic infarction (M4-M6 cortex): 3 Total score (0-10 with 10 being normal): 10. IMPRESSION: 1. No acute intracranial finding. ASPECTS is 10. 2. Active sinusitis with right maxillary fluid level. Right mastoid and middle ear partial opacification Electronically Signed   By: Marnee Spring M.D.   On: 02/03/2018 15:44      TODAY-DAY OF DISCHARGE:  Subjective:   Nemesis [place] today has no headache,no chest abdominal pain,no new weakness tingling or numbness, feels much better wants to go home today.   Objective:   Blood  pressure 126/86, pulse 68, temperature 97.8 F (36.6 C), temperature source Oral, resp. rate 18, height 5' (1.524 m), weight 50.9 kg (112 lb 3.4 oz), SpO2 96 %.  Intake/Output Summary (Last 24 hours) at  02/05/2018 1237 Last data filed at 02/05/2018 1100 Gross per 24 hour  Intake 800 ml  Output -  Net 800 ml   Filed Weights   02/03/18 1557 02/03/18 2315  Weight: 50.9 kg (112 lb 4 oz) 50.9 kg (112 lb 3.4 oz)    Exam: Awake Alert, Oriented *3, No new F.N deficits, Normal affect [place].AT,PERRAL Supple Neck,No JVD, No cervical lymphadenopathy appriciated.  Symmetrical Chest wall movement, Good air movement bilaterally, CTAB RRR,No Gallops,Rubs or new Murmurs, No Parasternal Heave +ve B.Sounds, Abd Soft, Non tender, No organomegaly appriciated, No rebound -guarding or rigidity. No Cyanosis, Clubbing or edema, No new Rash or bruise   PERTINENT RADIOLOGIC STUDIES: Dg Chest 1 View  Result Date: 02/03/2018 CLINICAL DATA:  57 year old female code stroke patient. Screening for metal. EXAM: CHEST  1 VIEW COMPARISON:  Chest radiographs 12/10/2010. FINDINGS: Portable AP semi upright view at 1749 hours. External clothing snaps and EKG leads. No radiopaque foreign body identified. Patchy and indistinct peripheral opacity in the right upper lung (arrow). Stable lung volumes. Mildly increased cardiac size. Other mediastinal contours are within normal limits. Visualized tracheal air column is within normal limits. No other confluent pulmonary opacity. No pneumothorax or pleural effusion. Negative visible bowel gas pattern. No acute osseous abnormality identified. IMPRESSION: 1. Indistinct increased peripheral right lung opacity. Consider aspiration in this clinical setting. 2. No chest radiopaque retained foreign body identified to contraindicate MRI. Electronically Signed   By: Odessa FlemingH  Hall M.D.   On: 02/03/2018 18:00   Mr Brain Wo Contrast  Result Date: 02/03/2018 CLINICAL DATA:  57 year old female with weakness and aphasia today. EXAM: MRI HEAD WITHOUT CONTRAST TECHNIQUE: Multiplanar, multiecho pulse sequences of the brain and surrounding structures were obtained without intravenous contrast. COMPARISON:  Head CT  without contrast 1538 hours today, and earlier. FINDINGS: Brain: Study is intermittently degraded by motion artifact despite repeated imaging attempts. No restricted diffusion to suggest acute infarction. No midline shift, mass effect, evidence of mass lesion, ventriculomegaly, extra-axial collection or acute intracranial hemorrhage. Cervicomedullary junction and pituitary are within normal limits. No definite cortical encephalomalacia or chronic cerebral blood products. Mild for age nonspecific scattered cerebral white matter T2 and FLAIR hyperintensity. The deep gray matter nuclei, brainstem, and cerebellum appear normal. Vascular: Major intracranial vascular flow voids are preserved, the distal left vertebral artery appears dominant. Skull and upper cervical spine: Negative visible cervical spine. Normal bone marrow signal. Sinuses/Orbits: Orbit motion artifact, grossly normal orbits soft tissues. Stable ethmoid and right maxillary sinus mucosal thickening. Other: Bilateral mastoid air cell effusions and/or sclerosis. Scalp and face soft tissues appear negative. IMPRESSION: 1. No acute intracranial abnormality identified on this intermittently motion degraded study. 2. Mild for age nonspecific cerebral white matter signal changes. 3. Acute or chronic paranasal sinus and mastoid inflammation as seen on CT today. Electronically Signed   By: Odessa FlemingH  Hall M.D.   On: 02/03/2018 18:49   Ct Head Code Stroke Wo Contrast  Result Date: 02/03/2018 CLINICAL DATA:  Code stroke.  Weakness and aphasia EXAM: CT HEAD WITHOUT CONTRAST TECHNIQUE: Contiguous axial images were obtained from the base of the skull through the vertex without intravenous contrast. COMPARISON:  12/10/2010 FINDINGS: Brain: No evidence of acute infarction, hemorrhage, hydrocephalus, extra-axial collection or mass lesion/mass effect. Vascular: No hyperdense vessel or  unexpected calcification. Skull: Dystrophic appearing calcification in the right forehead  soft tissues. Sinuses/Orbits: Kozel thickening in the paranasal sinuses including a right maxillary fluid level. Partial opacification of right mastoid and middle ear spaces. Other: These results were communicated to at 3:43 pmon 6/17/2019by text page via the [facility] messaging system. ASPECTS [facility] Stroke Program Early CT Score) - Ganglionic level infarction (caudate, lentiform nuclei, internal capsule, insula, M1-M3 cortex): 7 - Supraganglionic infarction (M4-M6 cortex): 3 Total score (0-10 with 10 being normal): 10. IMPRESSION: 1. No acute intracranial finding. ASPECTS is 10. 2. Active sinusitis with right maxillary fluid level. Right mastoid and middle ear partial opacification Electronically Signed   By: Marnee Spring M.D.   On: 02/03/2018 15:44     PERTINENT LAB RESULTS: CBC: Recent Labs    02/03/18 1530 02/03/18 1534 02/04/18 0247  WBC 7.0  --  6.0  HGB 11.0* 10.9* 10.5*  HCT 35.1* 32.0* 33.7*  PLT 230  --  231   CMET CMP     Component Value Date/Time   NA 139 02/05/2018 1013   K 3.4 (L) 02/05/2018 1013   CL 105 02/05/2018 1013   CO2 28 02/05/2018 1013   GLUCOSE 99 02/05/2018 1013   BUN 6 02/05/2018 1013   CREATININE 0.93 02/05/2018 1013   CALCIUM 8.9 02/05/2018 1013   PROT 6.9 02/03/2018 1530   ALBUMIN 3.5 02/03/2018 1530   AST 52 (H) 02/03/2018 1530   ALT 35 02/03/2018 1530   ALKPHOS 58 02/03/2018 1530   BILITOT 1.0 02/03/2018 1530   GFRNONAA >60 02/05/2018 1013   GFRAA >60 02/05/2018 1013    GFR Estimated Creatinine Clearance: 49.1 mL/min (by C-G formula based on SCr of 0.93 mg/dL). No results for input(s): LIPASE, AMYLASE in the last 72 hours. No results for input(s): CKTOTAL, CKMB, CKMBINDEX, TROPONINI in the last 72 hours. Invalid input(s): POCBNP No results for input(s): DDIMER in the last 72 hours. No results for input(s): HGBA1C in the last 72 hours. No results for input(s): CHOL, HDL, LDLCALC, TRIG, CHOLHDL, LDLDIRECT in the last 72 hours. Recent Labs     02/03/18 0247  TSH 246.485*   Recent Labs    02/03/18 0247  VITAMINB12 506   Coags: Recent Labs    02/03/18 1530  INR 0.98   Microbiology: Recent Results (from the past 240 hour(s))  Urine Culture     Status: None   Collection Time: 02/04/18 11:41 AM  Result Value Ref Range Status   Specimen Description URINE, RANDOM  Final   Special Requests NONE  Final   Culture   Final    NO GROWTH Performed at [facility], [address] [place], [place] [address]    Report Status 02/05/2018 FINAL  Final    FURTHER DISCHARGE INSTRUCTIONS:  Get Medicines reviewed and adjusted: Please take all your medications with you for your next visit with your Primary MD  Laboratory/radiological data: Please request your Primary MD to go over all hospital tests and procedure/radiological results at the follow up, please ask your Primary MD to get all Hospital records sent to his/her office.  In some cases, they will be blood work, cultures and biopsy results pending at the time of your discharge. Please request that your primary care M.D. goes through all the records of your hospital data and follows up on these results.  Also Note the following: If you experience worsening of your admission symptoms, develop shortness of breath, life threatening emergency, suicidal or homicidal thoughts  you must seek medical attention immediately by calling 911 or calling your MD immediately  if symptoms less severe.  You must read complete instructions/literature along with all the possible adverse reactions/side effects for all the Medicines you take and that have been prescribed to you. Take any new Medicines after you have completely understood and accpet all the possible adverse reactions/side effects.   Do not drive when taking Pain medications or sleeping medications (Benzodaizepines)  Do not take more than prescribed Pain, Sleep and Anxiety Medications. It is not advisable to combine  anxiety,sleep and pain medications without talking with your primary care practitioner  Special Instructions: If you have smoked or chewed Tobacco  in the last 2 yrs please stop smoking, stop any regular Alcohol  and or any Recreational drug use.  Wear Seat belts while driving.  Please note: You were cared for by a hospitalist during your hospital stay. Once you are discharged, your primary care physician will handle any further medical issues. Please note that NO REFILLS for any discharge medications will be authorized once you are discharged, as it is imperative that you return to your primary care physician (or establish a relationship with a primary care physician if you do not have one) for your post hospital discharge needs so that they can reassess your need for medications and monitor your lab values.  Total Time spent coordinating discharge including counseling, education and face to face time equals 35 minutes.  SignedJeoffrey Massed 02/05/2018 12:37 PM

## 2018-02-05 NOTE — Care Management Note (Signed)
Case Management Note  Patient Details  Name: [name] MRN: [id number] Date of Birth: 05-13-1961  Subjective/Objective:                    Action/Plan: Pt discharging home with self care. Pt has appointment with a PCP. CM provided her coupon card to assist with cost of medications if her medicare wont assist.  Pt states she has transportation home.   Expected Discharge Date:  02/05/18               Expected Discharge Plan:  Home/Self Care  In-House Referral:     Discharge planning Services  CM Consult, Medication Assistance  Post Acute Care Choice:    Choice offered to:     DME Arranged:    DME Agency:     HH Arranged:    HH Agency:     Status of Service:  Completed, signed off  If discussed at [employer] Length of Stay Meetings, dates discussed:    Additional Comments:  Kermit BaloKelli F Esias Mory, RN 02/05/2018, 2:14 PM

## 2018-02-05 NOTE — Progress Notes (Signed)
Pt being discharged from hospital per orders from MD. Pt educated on discharge instructions. Pt verbalized understanding of instructions. All questions and concerns were addressed. Pt's IV was removed prior to discharge. Pt exited hospital via wheelchair accompanied by staff. 

## 2018-02-07 ENCOUNTER — Ambulatory Visit: Payer: Medicare Other | Admitting: Family Medicine

## 2018-11-09 ENCOUNTER — Other Ambulatory Visit: Payer: Self-pay

## 2018-11-09 ENCOUNTER — Emergency Department (HOSPITAL_BASED_OUTPATIENT_CLINIC_OR_DEPARTMENT_OTHER): Payer: Medicare Other

## 2018-11-09 ENCOUNTER — Emergency Department (HOSPITAL_BASED_OUTPATIENT_CLINIC_OR_DEPARTMENT_OTHER)
Admission: EM | Admit: 2018-11-09 | Discharge: 2018-11-09 | Disposition: A | Discharge status: Home / Self Care | Payer: Medicare Other | Attending: Emergency Medicine | Admitting: Emergency Medicine

## 2018-11-09 ENCOUNTER — Encounter (HOSPITAL_BASED_OUTPATIENT_CLINIC_OR_DEPARTMENT_OTHER): Payer: Self-pay | Admitting: *Deleted

## 2018-11-09 DIAGNOSIS — E039 Hypothyroidism, unspecified: Secondary | ICD-10-CM | POA: Insufficient documentation

## 2018-11-09 DIAGNOSIS — M25571 Pain in right ankle and joints of right foot: Secondary | ICD-10-CM | POA: Insufficient documentation

## 2018-11-09 DIAGNOSIS — R2241 Localized swelling, mass and lump, right lower limb: Secondary | ICD-10-CM | POA: Diagnosis not present

## 2018-11-09 DIAGNOSIS — Y9289 Other specified places as the place of occurrence of the external cause: Secondary | ICD-10-CM | POA: Insufficient documentation

## 2018-11-09 DIAGNOSIS — Y9301 Activity, walking, marching and hiking: Secondary | ICD-10-CM | POA: Diagnosis not present

## 2018-11-09 DIAGNOSIS — Z79899 Other long term (current) drug therapy: Secondary | ICD-10-CM | POA: Diagnosis not present

## 2018-11-09 DIAGNOSIS — J449 Chronic obstructive pulmonary disease, unspecified: Secondary | ICD-10-CM | POA: Diagnosis not present

## 2018-11-09 DIAGNOSIS — Y998 Other external cause status: Secondary | ICD-10-CM | POA: Diagnosis not present

## 2018-11-09 DIAGNOSIS — W108XXA Fall (on) (from) other stairs and steps, initial encounter: Secondary | ICD-10-CM | POA: Insufficient documentation

## 2018-11-09 DIAGNOSIS — F17210 Nicotine dependence, cigarettes, uncomplicated: Secondary | ICD-10-CM | POA: Diagnosis not present

## 2018-11-09 NOTE — ED Notes (Signed)
Pt refused wheelchair, ambulatory with crutches

## 2018-11-09 NOTE — Discharge Instructions (Addendum)
Today your x-rays did not show any broken bones.  We discussed the need to aggressively treat your swelling as I suspect that that is contributing to your pain.  Please make sure that you are keeping your foot elevated above your heart as much as you can.  The Ace wrap will also help with swelling.  Please try to rest your ankle.  You can put weight on it as needed.  If your swelling and pain is not getting better by the end of the week please follow-up with Dr. Pearletha Forge.  If you develop swelling in your calf under your knee then you need to be evaluated for blood clots.    Please take Ibuprofen (Advil, motrin) and Tylenol (acetaminophen) to relieve your pain.  You may take up to 600 MG (3 pills) of normal strength ibuprofen every 8 hours as needed.  In between doses of ibuprofen you make take tylenol, up to 1,000 mg (two extra strength pills).  Do not take more than 3,000 mg tylenol in a 24 hour period.  Please check all medication labels as many medications such as pain and cold medications may contain tylenol.  Do not drink alcohol while taking these medications.  Do not take other NSAID'S while taking ibuprofen (such as aleve or naproxen).  Please take ibuprofen with food to decrease stomach upset.  Any NSAID has increased risk of GI bleeding and heart risks.

## 2018-11-09 NOTE — ED Notes (Signed)
X-ray at bedside

## 2018-11-09 NOTE — ED Notes (Signed)
ED Provider at bedside. 
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
## 2018-11-09 NOTE — ED Triage Notes (Signed)
Pt reports she tripped and fell down 2 steps 1.5 weeks ago. Right foot and ankle swollen. Pt denies fever, respiratory symptoms and known covid-19 exposure. She states she has been using a cane but has been having difficulty walking now

## 2019-06-29 ENCOUNTER — Other Ambulatory Visit: Payer: Self-pay

## 2019-06-29 ENCOUNTER — Encounter (HOSPITAL_COMMUNITY): Payer: Self-pay | Admitting: Physician Assistant

## 2019-06-29 ENCOUNTER — Inpatient Hospital Stay (HOSPITAL_COMMUNITY): Admission: RE | Admit: 2019-06-29 | Payer: Medicare Other | Source: Ambulatory Visit

## 2019-06-29 ENCOUNTER — Encounter (HOSPITAL_COMMUNITY): Payer: Self-pay | Admitting: *Deleted

## 2019-06-29 NOTE — Progress Notes (Signed)
PCP - does not have one Cardiologist - none  Chest x-ray - none EKG - none Stress Test - none ECHO - none Cardiac Cath - none  Sleep Study -none  CPAP - none  Fasting Blood Sugar - none Checks Blood Sugar _0____ times a day  Blood Thinner Instructions:none Aspirin Instructions:none Last Dose:none  Anesthesia review:   Patient denies shortness of breath, fever, cough and chest pain at PAT appointment   Patient verbalized understanding of instructions that were given to them at the PAT appointment. Patient was also instructed that they will need to review over the PAT instructions again at home before surgery.
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
## 2019-06-29 NOTE — Progress Notes (Signed)
Instructing patient via phone and going over medical history when patient hung up phone on nurse. I called back and got answering machine and LM on VM to call back  At [phone] or call [phone] for the rest of the instructions.

## 2019-06-30 ENCOUNTER — Telehealth (HOSPITAL_COMMUNITY): Payer: Self-pay | Admitting: *Deleted

## 2019-06-30 ENCOUNTER — Other Ambulatory Visit (HOSPITAL_COMMUNITY): Payer: Medicare Other

## 2019-06-30 ENCOUNTER — Other Ambulatory Visit (HOSPITAL_COMMUNITY): Payer: Self-pay

## 2019-06-30 ENCOUNTER — Inpatient Hospital Stay (HOSPITAL_COMMUNITY): Admission: RE | Admit: 2019-06-30 | Payer: Medicare Other | Source: Home / Self Care | Admitting: Orthopedic Surgery

## 2019-06-30 SURGERY — CLOSED REDUCTION, HIP
Anesthesia: Spinal | Site: Hip | Laterality: Left

## 2019-06-30 NOTE — Progress Notes (Signed)
Patient did not show at 8 AM for covid test. Patient did not show up at 1400 for surgery. Flow coordinator, Lewie Chamber RN , called patient at 1430 and left message. She called at 1500 and left message that surgery has been cancelled and patient needs to call Dr Aurea Graff office to reschedule.

## 2019-07-11 ENCOUNTER — Other Ambulatory Visit: Payer: Self-pay

## 2019-07-11 ENCOUNTER — Emergency Department (HOSPITAL_COMMUNITY)
Admission: EM | Admit: 2019-07-11 | Discharge: 2019-07-11 | Disposition: A | Discharge status: Home / Self Care | Payer: Medicare Other | Attending: Emergency Medicine | Admitting: Emergency Medicine

## 2019-07-11 ENCOUNTER — Encounter (HOSPITAL_COMMUNITY): Payer: Self-pay | Admitting: Obstetrics and Gynecology

## 2019-07-11 ENCOUNTER — Emergency Department (HOSPITAL_COMMUNITY): Payer: Medicare Other

## 2019-07-11 DIAGNOSIS — W19XXXA Unspecified fall, initial encounter: Secondary | ICD-10-CM | POA: Insufficient documentation

## 2019-07-11 DIAGNOSIS — Y939 Activity, unspecified: Secondary | ICD-10-CM | POA: Insufficient documentation

## 2019-07-11 DIAGNOSIS — E039 Hypothyroidism, unspecified: Secondary | ICD-10-CM | POA: Insufficient documentation

## 2019-07-11 DIAGNOSIS — Y929 Unspecified place or not applicable: Secondary | ICD-10-CM | POA: Insufficient documentation

## 2019-07-11 DIAGNOSIS — F17210 Nicotine dependence, cigarettes, uncomplicated: Secondary | ICD-10-CM | POA: Diagnosis not present

## 2019-07-11 DIAGNOSIS — L03113 Cellulitis of right upper limb: Secondary | ICD-10-CM | POA: Diagnosis not present

## 2019-07-11 DIAGNOSIS — J449 Chronic obstructive pulmonary disease, unspecified: Secondary | ICD-10-CM | POA: Diagnosis not present

## 2019-07-11 DIAGNOSIS — S73045A Central dislocation of left hip, initial encounter: Secondary | ICD-10-CM | POA: Diagnosis not present

## 2019-07-11 DIAGNOSIS — Z20828 Contact with and (suspected) exposure to other viral communicable diseases: Secondary | ICD-10-CM | POA: Diagnosis not present

## 2019-07-11 DIAGNOSIS — S79912A Unspecified injury of left hip, initial encounter: Secondary | ICD-10-CM | POA: Diagnosis present

## 2019-07-11 DIAGNOSIS — Y999 Unspecified external cause status: Secondary | ICD-10-CM | POA: Insufficient documentation

## 2019-07-11 LAB — CBC WITH DIFFERENTIAL/PLATELET
Abs Immature Granulocytes: 0.02 10*3/uL (ref 0.00–0.07)
Basophils Absolute: 0 10*3/uL (ref 0.0–0.1)
Basophils Relative: 1 %
Eosinophils Absolute: 0.1 10*3/uL (ref 0.0–0.5)
Eosinophils Relative: 2 %
HCT: 29.7 % — ABNORMAL LOW (ref 36.0–46.0)
Hemoglobin: 8.9 g/dL — ABNORMAL LOW (ref 12.0–15.0)
Immature Granulocytes: 0 %
Lymphocytes Relative: 28 %
Lymphs Abs: 1.7 10*3/uL (ref 0.7–4.0)
MCH: 27.8 pg (ref 26.0–34.0)
MCHC: 30 g/dL (ref 30.0–36.0)
MCV: 92.8 fL (ref 80.0–100.0)
Monocytes Absolute: 0.3 10*3/uL (ref 0.1–1.0)
Monocytes Relative: 5 %
Neutro Abs: 4 10*3/uL (ref 1.7–7.7)
Neutrophils Relative %: 64 %
Platelets: 440 10*3/uL — ABNORMAL HIGH (ref 150–400)
RBC: 3.2 MIL/uL — ABNORMAL LOW (ref 3.87–5.11)
RDW: 15.8 % — ABNORMAL HIGH (ref 11.5–15.5)
WBC: 6.2 10*3/uL (ref 4.0–10.5)
nRBC: 0 % (ref 0.0–0.2)

## 2019-07-11 LAB — BASIC METABOLIC PANEL
Anion gap: 8 (ref 5–15)
BUN: 18 mg/dL (ref 6–20)
CO2: 28 mmol/L (ref 22–32)
Calcium: 8.5 mg/dL — ABNORMAL LOW (ref 8.9–10.3)
Chloride: 105 mmol/L (ref 98–111)
Creatinine, Ser: 1.18 mg/dL — ABNORMAL HIGH (ref 0.44–1.00)
GFR calc Af Amer: 59 mL/min — ABNORMAL LOW (ref >60)
GFR calc non Af Amer: 51 mL/min — ABNORMAL LOW (ref >60)
Glucose, Bld: 79 mg/dL (ref 70–99)
Potassium: 3.2 mmol/L — ABNORMAL LOW (ref 3.5–5.1)
Sodium: 141 mmol/L (ref 135–145)

## 2019-07-11 MED ORDER — DOXYCYCLINE HYCLATE 100 MG PO CAPS: 100.0000 mg | ORAL_CAPSULE | Freq: Two times a day (BID) | ORAL | 0 refills | Status: AC

## 2019-07-11 MED ORDER — HYDROCODONE-ACETAMINOPHEN 5-325 MG PO TABS: 1.0000 | ORAL_TABLET | Freq: Four times a day (QID) | ORAL | 0 refills | Status: DC | PRN

## 2019-07-11 NOTE — ED Notes (Signed)
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

## 2019-07-11 NOTE — Discharge Instructions (Signed)
You can take Tylenol or Ibuprofen as directed for pain. You can alternate Tylenol and Ibuprofen every 4 hours. If you take Tylenol at 1pm, then you can take Ibuprofen at 5pm. Then you can take Tylenol again at 9pm.   Take pain medications as directed for break through pain. Do not drive or operate machinery while taking this medication.   Take antibiotics as directed. Please take all of your antibiotics until finished.  As we discussed, you need to call Dr. Marylyn Ishihara office and arrange for an appointment this week.  You have been tested for Covid as part of presurgical testing.  Needs to quarantine until you are able to see Dr. Lavina Hamman.  Return the emergency department for any fever, worsening redness or swelling around the arm, discoloration of the leg, numbness/weakness of the leg or any other worsening or concerning symptoms.

## 2019-07-11 NOTE — ED Notes (Signed)
Ptar contacted. 
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
## 2019-07-11 NOTE — ED Triage Notes (Signed)
Per EMS: Pt reports her hip has been dislocated for 2 months. Patient reports pain in her hip. Patient also has an abscess to her right forearm but denies use.  Patient is stuporous and arousable with contact and voice.  Patient is coming from hotel on [address].

## 2019-07-12 LAB — SARS CORONAVIRUS 2 (TAT 6-24 HRS): SARS Coronavirus 2: NEGATIVE

## 2019-07-24 ENCOUNTER — Encounter (HOSPITAL_COMMUNITY): Payer: Self-pay

## 2019-07-24 ENCOUNTER — Other Ambulatory Visit: Payer: Self-pay

## 2019-07-24 ENCOUNTER — Emergency Department (HOSPITAL_COMMUNITY): Payer: Medicare Other

## 2019-07-24 ENCOUNTER — Inpatient Hospital Stay (HOSPITAL_COMMUNITY)
Admission: EM | Admit: 2019-07-24 | Discharge: 2019-07-28 | DRG: 467 | Disposition: A | Discharge status: Home Health | Payer: Medicare Other | Attending: Internal Medicine | Admitting: Internal Medicine

## 2019-07-24 DIAGNOSIS — E039 Hypothyroidism, unspecified: Secondary | ICD-10-CM | POA: Diagnosis present

## 2019-07-24 DIAGNOSIS — F1910 Other psychoactive substance abuse, uncomplicated: Secondary | ICD-10-CM | POA: Diagnosis not present

## 2019-07-24 DIAGNOSIS — Z886 Allergy status to analgesic agent status: Secondary | ICD-10-CM | POA: Diagnosis not present

## 2019-07-24 DIAGNOSIS — J449 Chronic obstructive pulmonary disease, unspecified: Secondary | ICD-10-CM | POA: Diagnosis present

## 2019-07-24 DIAGNOSIS — D638 Anemia in other chronic diseases classified elsewhere: Secondary | ICD-10-CM

## 2019-07-24 DIAGNOSIS — Z8249 Family history of ischemic heart disease and other diseases of the circulatory system: Secondary | ICD-10-CM | POA: Diagnosis not present

## 2019-07-24 DIAGNOSIS — S73005S Unspecified dislocation of left hip, sequela: Secondary | ICD-10-CM | POA: Diagnosis not present

## 2019-07-24 DIAGNOSIS — F17210 Nicotine dependence, cigarettes, uncomplicated: Secondary | ICD-10-CM | POA: Diagnosis present

## 2019-07-24 DIAGNOSIS — Z91128 Patient's intentional underdosing of medication regimen for other reason: Secondary | ICD-10-CM | POA: Diagnosis not present

## 2019-07-24 DIAGNOSIS — E876 Hypokalemia: Secondary | ICD-10-CM | POA: Diagnosis present

## 2019-07-24 DIAGNOSIS — N179 Acute kidney failure, unspecified: Secondary | ICD-10-CM | POA: Diagnosis present

## 2019-07-24 DIAGNOSIS — S73045S Central dislocation of left hip, sequela: Secondary | ICD-10-CM | POA: Diagnosis present

## 2019-07-24 DIAGNOSIS — Y831 Surgical operation with implant of artificial internal device as the cause of abnormal reaction of the patient, or of later complication, without mention of misadventure at the time of the procedure: Secondary | ICD-10-CM | POA: Diagnosis present

## 2019-07-24 DIAGNOSIS — T84021A Dislocation of internal left hip prosthesis, initial encounter: Secondary | ICD-10-CM | POA: Diagnosis present

## 2019-07-24 DIAGNOSIS — T381X6A Underdosing of thyroid hormones and substitutes, initial encounter: Secondary | ICD-10-CM | POA: Diagnosis present

## 2019-07-24 DIAGNOSIS — Z96649 Presence of unspecified artificial hip joint: Secondary | ICD-10-CM

## 2019-07-24 DIAGNOSIS — Z20828 Contact with and (suspected) exposure to other viral communicable diseases: Secondary | ICD-10-CM | POA: Diagnosis present

## 2019-07-24 LAB — BASIC METABOLIC PANEL
Anion gap: 11 (ref 5–15)
BUN: 19 mg/dL (ref 6–20)
CO2: 28 mmol/L (ref 22–32)
Calcium: 9.6 mg/dL (ref 8.9–10.3)
Chloride: 101 mmol/L (ref 98–111)
Creatinine, Ser: 0.92 mg/dL (ref 0.44–1.00)
GFR calc Af Amer: >60 mL/min (ref >60)
GFR calc non Af Amer: >60 mL/min (ref >60)
Glucose, Bld: 75 mg/dL (ref 70–99)
Potassium: 3.3 mmol/L — ABNORMAL LOW (ref 3.5–5.1)
Sodium: 140 mmol/L (ref 135–145)

## 2019-07-24 LAB — CBC
HCT: 34.9 % — ABNORMAL LOW (ref 36.0–46.0)
Hemoglobin: 10.6 g/dL — ABNORMAL LOW (ref 12.0–15.0)
MCH: 28 pg (ref 26.0–34.0)
MCHC: 30.4 g/dL (ref 30.0–36.0)
MCV: 92.3 fL (ref 80.0–100.0)
Platelets: 325 10*3/uL (ref 150–400)
RBC: 3.78 MIL/uL — ABNORMAL LOW (ref 3.87–5.11)
RDW: 15.9 % — ABNORMAL HIGH (ref 11.5–15.5)
WBC: 5.7 10*3/uL (ref 4.0–10.5)
nRBC: 0 % (ref 0.0–0.2)

## 2019-07-24 MED ORDER — LEVOTHYROXINE SODIUM 50 MCG PO TABS
50.0000 ug | ORAL_TABLET | Freq: Every day | ORAL | Status: DC
  Administered 2019-07-25 – 2019-07-28 (×4): 50 ug via ORAL
  Filled 2019-07-24 (×4): qty 1

## 2019-07-24 MED ORDER — POTASSIUM CHLORIDE CRYS ER 20 MEQ PO TBCR
40.0000 meq | EXTENDED_RELEASE_TABLET | Freq: Once | ORAL | Status: AC
  Administered 2019-07-24: 40 meq via ORAL
  Filled 2019-07-24: qty 2

## 2019-07-24 MED ORDER — CYCLOBENZAPRINE HCL 10 MG PO TABS
10.0000 mg | ORAL_TABLET | Freq: Once | ORAL | Status: AC
  Administered 2019-07-24: 10 mg via ORAL
  Filled 2019-07-24: qty 1

## 2019-07-24 MED ORDER — IBUPROFEN 200 MG PO TABS
200.0000 mg | ORAL_TABLET | Freq: Three times a day (TID) | ORAL | Status: DC | PRN
  Administered 2019-07-25: 200 mg via ORAL
  Filled 2019-07-24: qty 1

## 2019-07-24 MED ORDER — HYDROCODONE-ACETAMINOPHEN 5-325 MG PO TABS
1.0000 | ORAL_TABLET | Freq: Four times a day (QID) | ORAL | Status: DC | PRN
  Administered 2019-07-25: 2 via ORAL
  Administered 2019-07-25 (×3): 1 via ORAL
  Administered 2019-07-25 – 2019-07-26 (×2): 2 via ORAL
  Filled 2019-07-24: qty 2
  Filled 2019-07-24: qty 1
  Filled 2019-07-24: qty 2
  Filled 2019-07-24 (×2): qty 1
  Filled 2019-07-24: qty 2

## 2019-07-24 MED ORDER — SENNOSIDES-DOCUSATE SODIUM 8.6-50 MG PO TABS: 1.0000 | ORAL_TABLET | Freq: Every evening | ORAL | Status: DC | PRN

## 2019-07-24 MED ORDER — MORPHINE SULFATE (PF) 2 MG/ML IV SOLN
2.0000 mg | INTRAVENOUS | Status: DC | PRN
  Administered 2019-07-26 – 2019-07-27 (×4): 2 mg via INTRAVENOUS
  Filled 2019-07-24 (×4): qty 1

## 2019-07-24 MED ORDER — FENTANYL CITRATE (PF) 100 MCG/2ML IJ SOLN
50.0000 ug | Freq: Once | INTRAMUSCULAR | Status: AC
  Administered 2019-07-24: 16:00:00 50 ug via INTRAVENOUS
  Filled 2019-07-24: qty 2

## 2019-07-24 NOTE — Progress Notes (Signed)
CSW received consult stating that patient needed assitance with following up on OP appointments. CSW spoke with EDP Dr. Jeanell Sparrow who reports patient has been needing to follow up with ortho OP to have a procedure to correct her left THA that became dislocated about 2 months ago. Patient comes to the ED with complaints of hip patient, but does not follow up as instructed. Dr. Jeanell Sparrow reports patient may not have the resources to follow up, so patient will likely be admitted to have the procedure done. No other social needs/intervention needed at this time, please consult again if any other needs arise. CSW signing off.   [place], LCSW Transitions of Care Department [facility] ED [phone]

## 2019-07-24 NOTE — ED Notes (Signed)
RN attempted IV x2.  
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
## 2019-07-24 NOTE — ED Notes (Signed)
IV team consulted- 
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
## 2019-07-24 NOTE — ED Notes (Signed)
ED TO INPATIENT HANDOFF REPORT  Name/Age/Gender [name] 58 y.o. female  Code Status Code Status History    Date Active Date Inactive Code Status Order ID Comments User Context   02/03/2018 2348 02/05/2018 1740 Full Code [id number]  Briscoe Deutscher, MD ED   Advance Care Planning Activity      Home/SNF/Other Home  Chief Complaint fall hip dislocation 2 months ago  Level of Care/Admitting Diagnosis ED Disposition    ED Disposition Condition Comment   Admit  Hospital Area: [facility] [[id number]]  Level of Care: Med-Surg [16]  Covid Evaluation: Asymptomatic Screening Protocol (No Symptoms)  Diagnosis: Dislocated hip, left, sequela [[id number]]  Admitting Physician: Lanae Boast [[id number]]  Attending Physician: Lanae Boast [[id number]]  Estimated length of stay: past midnight tomorrow  Certification:: I certify this patient will need inpatient services for at least 2 midnights  PT Class (Do Not Modify): Inpatient [101]  PT Acc Code (Do Not Modify): Private [1]       Medical History Past Medical History:  Diagnosis Date  . Anemia   . COPD (chronic obstructive pulmonary disease) (HCC)   . Depression   . HOH (hard of hearing)   . Hypothyroidism   . Thyroid disease   . Wears dentures    top    Allergies Allergies  Allergen Reactions  . Mobic [Meloxicam] Swelling  . Other   . Darvon [Propoxyphene] Hives, Itching and Rash    IV Location/Drains/Wounds Patient Lines/Drains/Airways Status   Active Line/Drains/Airways    Name:   Placement date:   Placement time:   Site:   Days:   Peripheral IV 07/24/19 Left;Anterior Forearm   07/24/19    1547    Forearm   less than 1   Incision (Closed) 10/07/14 Knee Left   10/07/14    1452     1751          Labs/Imaging Results for orders placed or performed during the hospital encounter of 07/24/19 (from the past 48 hour(s))  CBC     Status: Abnormal   Collection Time: 07/24/19  2:02 PM  Result Value Ref Range   WBC 5.7 4.0 - 10.5 K/uL   RBC 3.78 (L) 3.87 - 5.11 MIL/uL   Hemoglobin 10.6 (L) 12.0 - 15.0 g/dL   HCT 64.4 (L) 03.4 - 74.2 %   MCV 92.3 80.0 - 100.0 fL   MCH 28.0 26.0 - 34.0 pg   MCHC 30.4 30.0 - 36.0 g/dL   RDW 59.5 (H) 63.8 - 75.6 %   Platelets 325 150 - 400 K/uL   nRBC 0.0 0.0 - 0.2 %    Comment: Performed at [facility] [address] [place], [place] [address]  Basic metabolic panel     Status: Abnormal   Collection Time: 07/24/19  2:02 PM  Result Value Ref Range   Sodium 140 135 - 145 mmol/L   Potassium 3.3 (L) 3.5 - 5.1 mmol/L   Chloride 101 98 - 111 mmol/L   CO2 28 22 - 32 mmol/L   Glucose, Bld 75 70 - 99 mg/dL   BUN 19 6 - 20 mg/dL   Creatinine, Ser 5.18 0.44 - 1.00 mg/dL   Calcium 9.6 8.9 - 84.1 mg/dL   GFR calc non Af Amer >60 >60 mL/min   GFR calc Af Amer >60 >60 mL/min   Anion gap 11 5 - 15    Comment: Performed at [facility], [address]  [place], [place] [address]   Dg Chest Port 1 View  Result Date: 07/24/2019 CLINICAL DATA:  History of hip dislocation.  Preoperative exam. EXAM: PORTABLE CHEST 1 VIEW COMPARISON:  Single-view of the chest 12/13/2008. FINDINGS: Lungs clear. Heart size normal. Atherosclerosis. No pneumothorax or pleural effusion. No acute bony abnormality. Remote lower right rib fracture noted. IMPRESSION: No acute disease. Electronically Signed   By: Inge Rise M.D.   On: 07/24/2019 13:51   Dg Hip Unilat W Or Wo Pelvis 2-3 Views Left  Result Date: 07/24/2019 CLINICAL DATA:  Left hip pain. History of dislocation. No recent injury. EXAM: DG HIP (WITH OR WITHOUT PELVIS) 2-3V LEFT COMPARISON:  Plain films left hip 07/11/2019. FINDINGS: The patient has a left hip arthroplasty. The device is posteriorly, superiorly dislocated. Postoperative change of fixation of left acetabular fractures noted. No acute fracture. IMPRESSION: Dislocated left hip prosthesis Electronically Signed   By: Inge Rise M.D.    On: 07/24/2019 13:53    Pending Labs Unresulted Labs (From admission, onward)    Start     Ordered   07/24/19 1501  Urine rapid drug screen (hosp performed)not at [facility] - STAT,   STAT     07/24/19 1500   07/24/19 1500  SARS CORONAVIRUS 2 (TAT 6-24 HRS) Nasopharyngeal Nasopharyngeal Swab  (Asymptomatic/Tier 3)  Once,   STAT    Question Answer Comment  Is this test for diagnosis or screening Screening   Symptomatic for COVID-19 as defined by CDC No   Hospitalized for COVID-19 No   Admitted to ICU for COVID-19 No   Previously tested for COVID-19 Yes   Resident in a congregate (group) care setting No   Employed in healthcare setting No   Pregnant No      07/24/19 1500          Vitals/Pain Today's Vitals   07/24/19 1300 07/24/19 1530 07/24/19 1546 07/24/19 1600  BP: (!) 142/90 (!) 147/87 (!) 125/100 120/85  Pulse: 64 (!) 54 (!) 58 (!) 50  Resp: 17 14 12 12   Temp:      TempSrc:      SpO2: 100% 92% 98% 95%  PainSc:        Isolation Precautions No active isolations  Medications Medications  cyclobenzaprine (FLEXERIL) tablet 10 mg (10 mg Oral Given 07/24/19 1455)  fentaNYL (SUBLIMAZE) injection 50 mcg (50 mcg Intravenous Given 07/24/19 1617)    Mobility walks with device

## 2019-07-24 NOTE — ED Triage Notes (Signed)
Pt BIB [facility] from home. Pt has had a dislocated hip x2 months. EMS was called out today for pain in the hip and pt was not able to ambulate with her walker and lowered herself to the ground and called EMS. Pt has had several visits to the ED for this issue and has not followed up and has left AMA. Pt has hx of substance abuse. PMS in tact in injured leg. CAOx4.

## 2019-07-25 DIAGNOSIS — E876 Hypokalemia: Secondary | ICD-10-CM

## 2019-07-25 DIAGNOSIS — D638 Anemia in other chronic diseases classified elsewhere: Secondary | ICD-10-CM

## 2019-07-25 DIAGNOSIS — S73005S Unspecified dislocation of left hip, sequela: Secondary | ICD-10-CM

## 2019-07-25 DIAGNOSIS — F1910 Other psychoactive substance abuse, uncomplicated: Secondary | ICD-10-CM

## 2019-07-25 LAB — CBC
HCT: 31.3 % — ABNORMAL LOW (ref 36.0–46.0)
HCT: 34.2 % — ABNORMAL LOW (ref 36.0–46.0)
Hemoglobin: 9.7 g/dL — ABNORMAL LOW (ref 12.0–15.0)
Hemoglobin: 10.4 g/dL — ABNORMAL LOW (ref 12.0–15.0)
MCH: 28.3 pg (ref 26.0–34.0)
MCH: 28.2 pg (ref 26.0–34.0)
MCHC: 31 g/dL (ref 30.0–36.0)
MCHC: 30.4 g/dL (ref 30.0–36.0)
MCV: 91.3 fL (ref 80.0–100.0)
MCV: 92.7 fL (ref 80.0–100.0)
Platelets: 339 10*3/uL (ref 150–400)
Platelets: 374 10*3/uL (ref 150–400)
RBC: 3.43 MIL/uL — ABNORMAL LOW (ref 3.87–5.11)
RBC: 3.69 MIL/uL — ABNORMAL LOW (ref 3.87–5.11)
RDW: 15.9 % — ABNORMAL HIGH (ref 11.5–15.5)
RDW: 16 % — ABNORMAL HIGH (ref 11.5–15.5)
WBC: 5.7 10*3/uL (ref 4.0–10.5)
WBC: 5.8 10*3/uL (ref 4.0–10.5)
nRBC: 0 % (ref 0.0–0.2)
nRBC: 0 % (ref 0.0–0.2)

## 2019-07-25 LAB — BASIC METABOLIC PANEL
Anion gap: 10 (ref 5–15)
BUN: 20 mg/dL (ref 6–20)
CO2: 29 mmol/L (ref 22–32)
Calcium: 9.2 mg/dL (ref 8.9–10.3)
Chloride: 102 mmol/L (ref 98–111)
Creatinine, Ser: 1.14 mg/dL — ABNORMAL HIGH (ref 0.44–1.00)
GFR calc Af Amer: >60 mL/min (ref >60)
GFR calc non Af Amer: 53 mL/min — ABNORMAL LOW (ref >60)
Glucose, Bld: 135 mg/dL — ABNORMAL HIGH (ref 70–99)
Potassium: 3.4 mmol/L — ABNORMAL LOW (ref 3.5–5.1)
Sodium: 141 mmol/L (ref 135–145)

## 2019-07-25 LAB — TYPE AND SCREEN
ABO/RH(D): A POS
Antibody Screen: NEGATIVE

## 2019-07-25 LAB — RAPID URINE DRUG SCREEN, HOSP PERFORMED
Amphetamines: POSITIVE — AB
Barbiturates: NOT DETECTED
Benzodiazepines: NOT DETECTED
Cocaine: NOT DETECTED
Opiates: POSITIVE — AB
Tetrahydrocannabinol: NOT DETECTED

## 2019-07-25 LAB — HIV ANTIBODY (ROUTINE TESTING W REFLEX): HIV Screen 4th Generation wRfx: NONREACTIVE

## 2019-07-25 LAB — SARS CORONAVIRUS 2 (TAT 6-24 HRS): SARS Coronavirus 2: NEGATIVE

## 2019-07-25 LAB — SURGICAL PCR SCREEN
MRSA, PCR: NEGATIVE
Staphylococcus aureus: POSITIVE — AB

## 2019-07-25 LAB — TSH: TSH: 285.379 u[IU]/mL — ABNORMAL HIGH (ref 0.350–4.500)

## 2019-07-25 MED ORDER — CEFAZOLIN SODIUM-DEXTROSE 2-4 GM/100ML-% IV SOLN
2.0000 g | INTRAVENOUS | Status: AC
  Administered 2019-07-26: 09:00:00 2 g via INTRAVENOUS
  Filled 2019-07-25: qty 100

## 2019-07-25 MED ORDER — ENSURE PRE-SURGERY PO LIQD
296.0000 mL | Freq: Once | ORAL | Status: DC
  Filled 2019-07-25: qty 296

## 2019-07-25 MED ORDER — TRANEXAMIC ACID-NACL 1000-0.7 MG/100ML-% IV SOLN
1000.0000 mg | INTRAVENOUS | Status: AC
  Administered 2019-07-26: 09:00:00 1000 mg via INTRAVENOUS
  Filled 2019-07-25: qty 100

## 2019-07-25 MED ORDER — POTASSIUM CHLORIDE CRYS ER 20 MEQ PO TBCR
40.0000 meq | EXTENDED_RELEASE_TABLET | Freq: Once | ORAL | Status: AC
  Administered 2019-07-25: 40 meq via ORAL
  Filled 2019-07-25: qty 2

## 2019-07-25 MED ORDER — CHLORHEXIDINE GLUCONATE 4 % EX LIQD
60.0000 mL | Freq: Once | CUTANEOUS | Status: AC
  Administered 2019-07-26: 4 via TOPICAL
  Filled 2019-07-25: qty 15

## 2019-07-25 MED ORDER — POVIDONE-IODINE 10 % EX SWAB
2.0000 "application " | Freq: Once | CUTANEOUS | Status: AC
  Administered 2019-07-26: 2 via TOPICAL

## 2019-07-25 NOTE — Plan of Care (Signed)
  Problem: Clinical Measurements: Goal: Will remain free from infection Outcome: Progressing   Problem: Clinical Measurements: Goal: Respiratory complications will improve Outcome: Progressing   Problem: Clinical Measurements: Goal: Cardiovascular complication will be avoided Outcome: Progressing   Problem: Activity: Goal: Risk for activity intolerance will decrease Outcome: Progressing   Problem: Elimination: Goal: Will not experience complications related to bowel motility Outcome: Progressing   Problem: Pain Managment: Goal: General experience of comfort will improve Outcome: Progressing

## 2019-07-25 NOTE — Anesthesia Preprocedure Evaluation (Addendum)
Anesthesia Evaluation  Patient identified by MRN, date of birth, ID band Patient awake    Reviewed: Allergy & Precautions, NPO status , Patient's Chart, lab work & pertinent test results  History of Anesthesia Complications Negative for: history of anesthetic complications  Airway Mallampati: II  TM Distance: >3 FB Neck ROM: Full  Mouth opening: Limited Mouth Opening  Dental  (+) Edentulous Upper, Poor Dentition   Pulmonary COPD, Current Smoker,    Pulmonary exam normal        Cardiovascular Normal cardiovascular exam  EKG 07/24/19: SB, rate 47, nonspecific T-changes   Neuro/Psych Depression negative neurological ROS     GI/Hepatic negative GI ROS, Neg liver ROS,   Endo/Other  Hypothyroidism (untreated (TSH 285))   Renal/GU negative Renal ROS  negative genitourinary   Musculoskeletal negative musculoskeletal ROS (+)   Abdominal   Peds  Hematology  (+) anemia , Hgb 9.7   Anesthesia Other Findings Day of surgery medications reviewed with patient.  Reproductive/Obstetrics negative OB ROS                          Anesthesia Physical Anesthesia Plan  ASA: III  Anesthesia Plan: General   Post-op Pain Management:    Induction: Intravenous  PONV Risk Score and Plan: 4 or greater and Treatment may vary due to age or medical condition, Ondansetron, Dexamethasone and Midazolam  Airway Management Planned: Oral ETT  Additional Equipment: None  Intra-op Plan:   Post-operative Plan: Extubation in OR  Informed Consent:   Plan Discussed with:   Anesthesia Plan Comments:         Anesthesia Quick Evaluation
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
## 2019-07-25 NOTE — Progress Notes (Signed)
PROGRESS NOTE    [name]  KDT:[id number] DOB: Oct 26, 1960 DOA: 07/24/2019 PCP: Patient, No Pcp Per   Brief Narrative:  58 year old lady with prior history of hypothyroidism, history of substance abuse disorder left THA on 04/10/2010 had dislocation about 2 months ago at home.  Patient came into ER couple of times for hip pain but was recommended to follow-up with orthopedics as outpatient but she reports missing appointments with surgery due to family issues.  Since she comes in this admission orthopedic surgery consulted and plan for OR/surgical repair tomorrow.  Assessment & Plan:   Principal Problem:   Dislocated hip, left, sequela Active Problems:   Polysubstance abuse (HCC)   Hypokalemia   Anemia of chronic disease  Dislocation of the left hip Patient missed many appointments for elective surgery and had to come to ED for persistent pain.  Orthopedic surgery consulted and plan for surgical intervention tomorrow. Meanwhile continue with pain meds at this time.   History of substance abuse disorder Counseling provided Continue with home medications.   Hypothyroidism Continue with Synthroid. TSH greater than 285 probably patient is noncompliant to medications. Get free T4.    Anemia of chronic disease Transfuse to keep hemoglobin greater than 7  . Hypokalemia Replaced    DVT prophylaxis: SCDs Code Status: Full code Family Communication: None at bedside  Disposition:  Pending their evaluation by orthopedics   Consultants:   ORTHOPEDICS  Procedures: None  Antimicrobials: None  Subjective: Pain controlled   Objective: Vitals:   07/24/19 1654 07/24/19 2035 07/24/19 2050 07/25/19 0550  BP: (!) 139/94 124/76  121/86  Pulse: (!) 50 60  78  Resp: 16 19  17   Temp: (!) 97.5 F (36.4 C) 97.7 F (36.5 C)  98.1 F (36.7 C)  TempSrc: Oral Oral  Oral  SpO2: 99% 99%  94%  Weight:   53 kg   Height:   5' (1.524 m)     Intake/Output Summary (Last  24 hours) at 07/25/2019 1123 Last data filed at 07/25/2019 1000 Gross per 24 hour  Intake 690 ml  Output 500 ml  Net 190 ml   Filed Weights   07/24/19 2050  Weight: 53 kg    Examination:  General exam: Appears calm and comfortable  Respiratory system: Clear to auscultation. Respiratory effort normal. Cardiovascular system: S1 & S2 heard, RRR. Gastrointestinal system: Abdomen is nondistended, soft and nontender . Central nervous system: Alert and oriented. No focal neurological deficits. Extremities: Left hip tenderness present Skin: No rashes, lesions or ulcers Psychiatry: Mood appropriate    Data Reviewed: I have personally reviewed following labs and imaging studies  CBC: Recent Labs  Lab 07/24/19 1402 07/25/19 0239  WBC 5.7 5.7  HGB 10.6* 9.7*  HCT 34.9* 31.3*  MCV 92.3 91.3  PLT 325 339   Basic Metabolic Panel: Recent Labs  Lab 07/24/19 1402 07/25/19 0239  NA 140 141  K 3.3* 3.4*  CL 101 102  CO2 28 29  GLUCOSE 75 135*  BUN 19 20  CREATININE 0.92 1.14*  CALCIUM 9.6 9.2   GFR: Estimated Creatinine Clearance: 39.1 mL/min (A) (by C-G formula based on SCr of 1.14 mg/dL (H)). Liver Function Tests: No results for input(s): AST, ALT, ALKPHOS, BILITOT, PROT, ALBUMIN in the last 168 hours. No results for input(s): LIPASE, AMYLASE in the last 168 hours. No results for input(s): AMMONIA in the last 168 hours. Coagulation Profile: No results for input(s): INR, PROTIME in the last 168 hours. Cardiac Enzymes: No  results for input(s): CKTOTAL, CKMB, CKMBINDEX, TROPONINI in the last 168 hours. BNP (last 3 results) No results for input(s): PROBNP in the last 8760 hours. HbA1C: No results for input(s): HGBA1C in the last 72 hours. CBG: No results for input(s): GLUCAP in the last 168 hours. Lipid Profile: No results for input(s): CHOL, HDL, LDLCALC, TRIG, CHOLHDL, LDLDIRECT in the last 72 hours. Thyroid Function Tests: Recent Labs    07/25/19 0239  TSH  285.379*   Anemia Panel: No results for input(s): VITAMINB12, FOLATE, FERRITIN, TIBC, IRON, RETICCTPCT in the last 72 hours. Sepsis Labs: No results for input(s): PROCALCITON, LATICACIDVEN in the last 168 hours.  Recent Results (from the past 240 hour(s))  SARS CORONAVIRUS 2 (TAT 6-24 HRS) Nasopharyngeal Nasopharyngeal Swab     Status: None   Collection Time: 07/24/19  4:19 PM   Specimen: Nasopharyngeal Swab  Result Value Ref Range Status   SARS Coronavirus 2 NEGATIVE NEGATIVE Final    Comment: (NOTE) SARS-CoV-2 target nucleic acids are NOT DETECTED. The SARS-CoV-2 RNA is generally detectable in upper and lower respiratory specimens during the acute phase of infection. Negative results do not preclude SARS-CoV-2 infection, do not rule out co-infections with other pathogens, and should not be used as the sole basis for treatment or other patient management decisions. Negative results must be combined with clinical observations, patient history, and epidemiological information. The expected result is Negative. Fact Sheet for Patients: SugarRoll.be Fact Sheet for Healthcare Providers: https://www.woods-mathews.com/ This test is not yet approved or cleared by the [place] FDA and  has been authorized for detection and/or diagnosis of SARS-CoV-2 by FDA under an Emergency Use Authorization (EUA). This EUA will remain  in effect (meaning this test can be used) for the duration of the COVID-19 declaration under Section 56 4(b)(1) of the Act, 21 U.S.C. section 360bbb-3(b)(1), unless the authorization is terminated or revoked sooner. Performed at [facility], [place] [address] [place], [place] [address]          Radiology Studies: Dg Chest [facility] 1 View  Result Date: 07/24/2019 CLINICAL DATA:  History of hip dislocation.  Preoperative exam. EXAM: PORTABLE CHEST 1 VIEW COMPARISON:  Single-view of the chest 12/13/2008. FINDINGS: Lungs  clear. Heart size normal. Atherosclerosis. No pneumothorax or pleural effusion. No acute bony abnormality. Remote lower right rib fracture noted. IMPRESSION: No acute disease. Electronically Signed   By: Inge Rise M.D.   On: 07/24/2019 13:51   Dg Hip Unilat W Or Wo Pelvis 2-3 Views Left  Result Date: 07/24/2019 CLINICAL DATA:  Left hip pain. History of dislocation. No recent injury. EXAM: DG HIP (WITH OR WITHOUT PELVIS) 2-3V LEFT COMPARISON:  Plain films left hip 07/11/2019. FINDINGS: The patient has a left hip arthroplasty. The device is posteriorly, superiorly dislocated. Postoperative change of fixation of left acetabular fractures noted. No acute fracture. IMPRESSION: Dislocated left hip prosthesis Electronically Signed   By: Inge Rise M.D.   On: 07/24/2019 13:53        Scheduled Meds: . levothyroxine  50 mcg Oral QAC breakfast   Continuous Infusions:   LOS: 1 day       Hosie Poisson, MD [facility]  07/25/2019, 11:23 AM

## 2019-07-25 NOTE — Progress Notes (Signed)
Pt stable though reports feeling depressed overall. She states she does not want to eat but will drink because she feels so down. Pt denies thoughts of harming herself at this time. Rn encouraged pt to eat and hydrate as much as possible. No needs at this time Rn managing pt pain overall. Rn will continue to monitor.

## 2019-07-26 ENCOUNTER — Inpatient Hospital Stay (HOSPITAL_COMMUNITY): Payer: Medicare Other

## 2019-07-26 ENCOUNTER — Inpatient Hospital Stay (HOSPITAL_COMMUNITY): Payer: Medicare Other | Admitting: Anesthesiology

## 2019-07-26 ENCOUNTER — Encounter (HOSPITAL_COMMUNITY)
Admission: EM | Disposition: A | Discharge status: Home Health | Payer: Self-pay | Source: Home / Self Care | Attending: Internal Medicine

## 2019-07-26 HISTORY — PX: TOTAL HIP REVISION: SHX763

## 2019-07-26 LAB — IRON AND TIBC
Iron: 63 ug/dL (ref 28–170)
Saturation Ratios: 22 % (ref 10.4–31.8)
TIBC: 291 ug/dL (ref 250–450)
UIBC: 228 ug/dL

## 2019-07-26 LAB — RETICULOCYTES
Immature Retic Fract: 9.1 % (ref 2.3–15.9)
RBC.: 3.8 MIL/uL — ABNORMAL LOW (ref 3.87–5.11)
Retic Count, Absolute: 35.3 10*3/uL (ref 19.0–186.0)
Retic Ct Pct: 0.9 % (ref 0.4–3.1)

## 2019-07-26 LAB — BASIC METABOLIC PANEL
Anion gap: 10 (ref 5–15)
BUN: 18 mg/dL (ref 6–20)
CO2: 28 mmol/L (ref 22–32)
Calcium: 9.3 mg/dL (ref 8.9–10.3)
Chloride: 103 mmol/L (ref 98–111)
Creatinine, Ser: 1.03 mg/dL — ABNORMAL HIGH (ref 0.44–1.00)
GFR calc Af Amer: >60 mL/min (ref >60)
GFR calc non Af Amer: >60 mL/min (ref >60)
Glucose, Bld: 108 mg/dL — ABNORMAL HIGH (ref 70–99)
Potassium: 4.2 mmol/L (ref 3.5–5.1)
Sodium: 141 mmol/L (ref 135–145)

## 2019-07-26 LAB — FERRITIN: Ferritin: 168 ng/mL (ref 11–307)

## 2019-07-26 LAB — VITAMIN B12: Vitamin B-12: 458 pg/mL (ref 180–914)

## 2019-07-26 LAB — FOLATE: Folate: 6.6 ng/mL (ref >5.9)

## 2019-07-26 SURGERY — TOTAL HIP REVISION
Anesthesia: General | Site: Hip | Laterality: Left

## 2019-07-26 MED ORDER — SUGAMMADEX SODIUM 200 MG/2ML IV SOLN
INTRAVENOUS | Status: DC | PRN
  Administered 2019-07-26: 240 mg via INTRAVENOUS

## 2019-07-26 MED ORDER — SODIUM CHLORIDE 0.9 % IV SOLN
INTRAVENOUS | Status: DC
  Administered 2019-07-26 – 2019-07-27 (×3): via INTRAVENOUS

## 2019-07-26 MED ORDER — LACTATED RINGERS IV SOLN
INTRAVENOUS | Status: DC | PRN
  Administered 2019-07-26 (×2): via INTRAVENOUS

## 2019-07-26 MED ORDER — ONDANSETRON HCL 4 MG PO TABS: 4.0000 mg | ORAL_TABLET | Freq: Four times a day (QID) | ORAL | Status: DC | PRN

## 2019-07-26 MED ORDER — HYDROCODONE-ACETAMINOPHEN 5-325 MG PO TABS
1.0000 | ORAL_TABLET | ORAL | Status: DC | PRN
  Administered 2019-07-26 (×2): 1 via ORAL
  Filled 2019-07-26 (×2): qty 1

## 2019-07-26 MED ORDER — DOCUSATE SODIUM 100 MG PO CAPS
100.0000 mg | ORAL_CAPSULE | Freq: Two times a day (BID) | ORAL | Status: DC
  Administered 2019-07-26 – 2019-07-28 (×4): 100 mg via ORAL
  Filled 2019-07-26 (×4): qty 1

## 2019-07-26 MED ORDER — TRANEXAMIC ACID-NACL 1000-0.7 MG/100ML-% IV SOLN
1000.0000 mg | Freq: Once | INTRAVENOUS | Status: AC
  Administered 2019-07-26: 14:00:00 1000 mg via INTRAVENOUS
  Filled 2019-07-26: qty 100

## 2019-07-26 MED ORDER — PHENYLEPHRINE HCL (PRESSORS) 10 MG/ML IV SOLN
INTRAVENOUS | Status: AC
  Filled 2019-07-26: qty 1

## 2019-07-26 MED ORDER — ONDANSETRON HCL 4 MG/2ML IJ SOLN: 4.0000 mg | Freq: Four times a day (QID) | INTRAMUSCULAR | Status: DC | PRN

## 2019-07-26 MED ORDER — SUCCINYLCHOLINE CHLORIDE 200 MG/10ML IV SOSY
PREFILLED_SYRINGE | INTRAVENOUS | Status: DC | PRN
  Administered 2019-07-26: 80 mg via INTRAVENOUS

## 2019-07-26 MED ORDER — METHOCARBAMOL 500 MG IVPB - SIMPLE MED
500.0000 mg | Freq: Four times a day (QID) | INTRAVENOUS | Status: DC | PRN
  Administered 2019-07-26: 12:00:00 500 mg via INTRAVENOUS
  Filled 2019-07-26: qty 50

## 2019-07-26 MED ORDER — LIDOCAINE 2% (20 MG/ML) 5 ML SYRINGE
INTRAMUSCULAR | Status: DC | PRN
  Administered 2019-07-26: 40 mg via INTRAVENOUS
  Administered 2019-07-26: 20 mg via INTRAVENOUS

## 2019-07-26 MED ORDER — ALUM & MAG HYDROXIDE-SIMETH 200-200-20 MG/5ML PO SUSP: 15.0000 mL | ORAL | Status: DC | PRN

## 2019-07-26 MED ORDER — BISACODYL 10 MG RE SUPP: 10.0000 mg | Freq: Every day | RECTAL | Status: DC | PRN

## 2019-07-26 MED ORDER — FENTANYL CITRATE (PF) 100 MCG/2ML IJ SOLN
INTRAMUSCULAR | Status: AC
  Filled 2019-07-26: qty 2

## 2019-07-26 MED ORDER — METHOCARBAMOL 500 MG PO TABS
500.0000 mg | ORAL_TABLET | Freq: Four times a day (QID) | ORAL | Status: DC | PRN
  Administered 2019-07-26 – 2019-07-27 (×3): 500 mg via ORAL
  Filled 2019-07-26 (×4): qty 1

## 2019-07-26 MED ORDER — MIDAZOLAM HCL 2 MG/2ML IJ SOLN
INTRAMUSCULAR | Status: AC
  Filled 2019-07-26: qty 2

## 2019-07-26 MED ORDER — SODIUM CHLORIDE 0.9 % IR SOLN
Status: DC | PRN
  Administered 2019-07-26: 1000 mL

## 2019-07-26 MED ORDER — STERILE WATER FOR IRRIGATION IR SOLN
Status: DC | PRN
  Administered 2019-07-26: 2000 mL

## 2019-07-26 MED ORDER — PHENYLEPHRINE 40 MCG/ML (10ML) SYRINGE FOR IV PUSH (FOR BLOOD PRESSURE SUPPORT)
PREFILLED_SYRINGE | INTRAVENOUS | Status: DC | PRN
  Administered 2019-07-26: 120 ug via INTRAVENOUS
  Administered 2019-07-26 (×3): 80 ug via INTRAVENOUS

## 2019-07-26 MED ORDER — PHENYLEPHRINE HCL-NACL 10-0.9 MG/250ML-% IV SOLN
INTRAVENOUS | Status: DC | PRN
  Administered 2019-07-26: 50 ug/min via INTRAVENOUS

## 2019-07-26 MED ORDER — ROCURONIUM BROMIDE 10 MG/ML (PF) SYRINGE
PREFILLED_SYRINGE | INTRAVENOUS | Status: DC | PRN
  Administered 2019-07-26 (×2): 30 mg via INTRAVENOUS

## 2019-07-26 MED ORDER — FENTANYL CITRATE (PF) 250 MCG/5ML IJ SOLN
INTRAMUSCULAR | Status: DC | PRN
  Administered 2019-07-26 (×5): 50 ug via INTRAVENOUS

## 2019-07-26 MED ORDER — PROPOFOL 10 MG/ML IV BOLUS
INTRAVENOUS | Status: DC | PRN
  Administered 2019-07-26: 40 mg via INTRAVENOUS
  Administered 2019-07-26: 80 mg via INTRAVENOUS

## 2019-07-26 MED ORDER — DIPHENHYDRAMINE HCL 12.5 MG/5ML PO ELIX: 12.5000 mg | ORAL_SOLUTION | ORAL | Status: DC | PRN

## 2019-07-26 MED ORDER — METHOCARBAMOL 500 MG IVPB - SIMPLE MED
INTRAVENOUS | Status: AC
  Filled 2019-07-26: qty 50

## 2019-07-26 MED ORDER — POLYETHYLENE GLYCOL 3350 17 G PO PACK
17.0000 g | PACK | Freq: Two times a day (BID) | ORAL | Status: DC
  Administered 2019-07-26 – 2019-07-27 (×3): 17 g via ORAL
  Filled 2019-07-26 (×4): qty 1

## 2019-07-26 MED ORDER — 0.9 % SODIUM CHLORIDE (POUR BTL) OPTIME
TOPICAL | Status: DC | PRN
  Administered 2019-07-26: 1000 mL

## 2019-07-26 MED ORDER — CEFAZOLIN SODIUM-DEXTROSE 2-4 GM/100ML-% IV SOLN
2.0000 g | Freq: Four times a day (QID) | INTRAVENOUS | Status: AC
  Administered 2019-07-26 (×2): 2 g via INTRAVENOUS
  Filled 2019-07-26 (×2): qty 100

## 2019-07-26 MED ORDER — MAGNESIUM CITRATE PO SOLN: 1.0000 | Freq: Once | ORAL | Status: DC | PRN

## 2019-07-26 MED ORDER — PHENOL 1.4 % MT LIQD: 1.0000 | OROMUCOSAL | Status: DC | PRN

## 2019-07-26 MED ORDER — MENTHOL 3 MG MT LOZG: 1.0000 | LOZENGE | OROMUCOSAL | Status: DC | PRN

## 2019-07-26 MED ORDER — PROMETHAZINE HCL 25 MG/ML IJ SOLN: 6.2500 mg | INTRAMUSCULAR | Status: DC | PRN

## 2019-07-26 MED ORDER — ASPIRIN 81 MG PO CHEW
81.0000 mg | CHEWABLE_TABLET | Freq: Two times a day (BID) | ORAL | Status: DC
  Administered 2019-07-26 – 2019-07-28 (×4): 81 mg via ORAL
  Filled 2019-07-26 (×4): qty 1

## 2019-07-26 MED ORDER — EPHEDRINE SULFATE-NACL 50-0.9 MG/10ML-% IV SOSY
PREFILLED_SYRINGE | INTRAVENOUS | Status: DC | PRN
  Administered 2019-07-26: 10 mg via INTRAVENOUS

## 2019-07-26 MED ORDER — DEXAMETHASONE SODIUM PHOSPHATE 10 MG/ML IJ SOLN
10.0000 mg | Freq: Once | INTRAMUSCULAR | Status: AC
  Administered 2019-07-27: 10:00:00 10 mg via INTRAVENOUS
  Filled 2019-07-26: qty 1

## 2019-07-26 MED ORDER — ACETAMINOPHEN 10 MG/ML IV SOLN
INTRAVENOUS | Status: AC
  Filled 2019-07-26: qty 100

## 2019-07-26 MED ORDER — ALBUMIN HUMAN 5 % IV SOLN
INTRAVENOUS | Status: DC | PRN
  Administered 2019-07-26: 09:00:00 via INTRAVENOUS

## 2019-07-26 MED ORDER — METOCLOPRAMIDE HCL 5 MG/ML IJ SOLN: 5.0000 mg | Freq: Three times a day (TID) | INTRAMUSCULAR | Status: DC | PRN

## 2019-07-26 MED ORDER — FENTANYL CITRATE (PF) 250 MCG/5ML IJ SOLN
INTRAMUSCULAR | Status: AC
  Filled 2019-07-26: qty 5

## 2019-07-26 MED ORDER — OXYCODONE HCL 5 MG/5ML PO SOLN: 5.0000 mg | Freq: Once | ORAL | Status: AC | PRN

## 2019-07-26 MED ORDER — OXYCODONE HCL 5 MG PO TABS
ORAL_TABLET | ORAL | Status: AC
  Filled 2019-07-26: qty 1

## 2019-07-26 MED ORDER — ACETAMINOPHEN 10 MG/ML IV SOLN
1000.0000 mg | Freq: Once | INTRAVENOUS | Status: DC | PRN
  Administered 2019-07-26: 11:00:00 1000 mg via INTRAVENOUS

## 2019-07-26 MED ORDER — FENTANYL CITRATE (PF) 100 MCG/2ML IJ SOLN
25.0000 ug | INTRAMUSCULAR | Status: DC | PRN
  Administered 2019-07-26 (×3): 50 ug via INTRAVENOUS

## 2019-07-26 MED ORDER — ONDANSETRON HCL 4 MG/2ML IJ SOLN
INTRAMUSCULAR | Status: DC | PRN
  Administered 2019-07-26: 4 mg via INTRAVENOUS

## 2019-07-26 MED ORDER — OXYCODONE HCL 5 MG PO TABS
5.0000 mg | ORAL_TABLET | Freq: Once | ORAL | Status: AC | PRN
  Administered 2019-07-26: 5 mg via ORAL

## 2019-07-26 MED ORDER — CHLORHEXIDINE GLUCONATE CLOTH 2 % EX PADS
6.0000 | MEDICATED_PAD | Freq: Every day | CUTANEOUS | Status: DC
  Administered 2019-07-26 – 2019-07-28 (×3): 6 via TOPICAL

## 2019-07-26 MED ORDER — MIDAZOLAM HCL 5 MG/5ML IJ SOLN
INTRAMUSCULAR | Status: DC | PRN
  Administered 2019-07-26: 2 mg via INTRAVENOUS

## 2019-07-26 MED ORDER — METOCLOPRAMIDE HCL 5 MG PO TABS: 5.0000 mg | ORAL_TABLET | Freq: Three times a day (TID) | ORAL | Status: DC | PRN

## 2019-07-26 MED ORDER — FERROUS SULFATE 325 (65 FE) MG PO TABS
325.0000 mg | ORAL_TABLET | Freq: Three times a day (TID) | ORAL | Status: DC
  Administered 2019-07-27: 325 mg via ORAL
  Filled 2019-07-26 (×3): qty 1

## 2019-07-26 MED ORDER — DEXAMETHASONE SODIUM PHOSPHATE 10 MG/ML IJ SOLN
INTRAMUSCULAR | Status: DC | PRN
  Administered 2019-07-26: 4 mg via INTRAVENOUS

## 2019-07-26 SURGICAL SUPPLY — 63 items
ADH SKN CLS APL DERMABOND .7 (GAUZE/BANDAGES/DRESSINGS) ×1
BAG DECANTER FOR FLEXI CONT (MISCELLANEOUS) ×3 IMPLANT
BAG SPEC THK2 15X12 ZIP CLS (MISCELLANEOUS) ×1
BAG ZIPLOCK 12X15 (MISCELLANEOUS) ×3 IMPLANT
BLADE SAW SGTL 11.0X1.19X90.0M (BLADE) IMPLANT
BLADE SAW SGTL 18X1.27X75 (BLADE) ×2 IMPLANT
BLADE SAW SGTL 18X1.27X75MM (BLADE) ×1
COVER SURGICAL LIGHT HANDLE (MISCELLANEOUS) ×3 IMPLANT
DERMABOND ADVANCED (GAUZE/BANDAGES/DRESSINGS) ×2
DERMABOND ADVANCED .7 DNX12 (GAUZE/BANDAGES/DRESSINGS) ×1 IMPLANT
DRAPE ORTHO SPLIT 77X108 STRL (DRAPES) ×6
DRAPE POUCH INSTRU U-SHP 10X18 (DRAPES) ×3 IMPLANT
DRAPE SURG 17X11 SM STRL (DRAPES) ×3 IMPLANT
DRAPE SURG ORHT 6 SPLT 77X108 (DRAPES) ×2 IMPLANT
DRAPE U-SHAPE 47X51 STRL (DRAPES) ×3 IMPLANT
DRESSING AQUACEL AG SP 3.5X10 (GAUZE/BANDAGES/DRESSINGS) IMPLANT
DRSG AQUACEL AG ADV 3.5X10 (GAUZE/BANDAGES/DRESSINGS) IMPLANT
DRSG AQUACEL AG ADV 3.5X14 (GAUZE/BANDAGES/DRESSINGS) ×2 IMPLANT
DRSG AQUACEL AG SP 3.5X10 (GAUZE/BANDAGES/DRESSINGS)
DURAPREP 26ML APPLICATOR (WOUND CARE) ×3 IMPLANT
ELECT BLADE TIP CTD 4 INCH (ELECTRODE) ×3 IMPLANT
ELECT REM PT RETURN 15FT ADLT (MISCELLANEOUS) ×3 IMPLANT
FACESHIELD WRAPAROUND (MASK) ×12 IMPLANT
FACESHIELD WRAPAROUND OR TEAM (MASK) ×4 IMPLANT
GAUZE SPONGE 2X2 8PLY STRL LF (GAUZE/BANDAGES/DRESSINGS) ×1 IMPLANT
GLOVE BIOGEL PI IND STRL 7.5 (GLOVE) ×1 IMPLANT
GLOVE BIOGEL PI IND STRL 8.5 (GLOVE) ×1 IMPLANT
GLOVE BIOGEL PI INDICATOR 7.5 (GLOVE) ×6
GLOVE BIOGEL PI INDICATOR 8.5 (GLOVE) ×2
GLOVE ECLIPSE 8.0 STRL XLNG CF (GLOVE) ×6 IMPLANT
GOWN STRL REUS W/TWL 2XL LVL3 (GOWN DISPOSABLE) ×3 IMPLANT
GOWN STRL REUS W/TWL LRG LVL3 (GOWN DISPOSABLE) ×3 IMPLANT
HANDPIECE INTERPULSE COAX TIP (DISPOSABLE)
HEAD CERAMIC 36 PLUS 8.5 12 14 (Hips) ×2 IMPLANT
KIT TURNOVER KIT A (KITS) ×2 IMPLANT
LINER NEUTRAL 52X36X54 PLUS 4 (Liner) ×2 IMPLANT
MANIFOLD NEPTUNE II (INSTRUMENTS) ×3 IMPLANT
MARKER SKIN DUAL TIP RULER LAB (MISCELLANEOUS) ×3 IMPLANT
NDL SAFETY ECLIPSE 18X1.5 (NEEDLE) ×1 IMPLANT
NEEDLE HYPO 18GX1.5 SHARP (NEEDLE) ×3
NS IRRIG 1000ML POUR BTL (IV SOLUTION) ×6 IMPLANT
PACK TOTAL JOINT (CUSTOM PROCEDURE TRAY) ×3 IMPLANT
PADDING CAST COTTON 6X4 STRL (CAST SUPPLIES) ×3 IMPLANT
PENCIL SMOKE EVACUATOR (MISCELLANEOUS) ×2 IMPLANT
PROTECTOR NERVE ULNAR (MISCELLANEOUS) ×3 IMPLANT
SET HNDPC FAN SPRY TIP SCT (DISPOSABLE) IMPLANT
SPONGE GAUZE 2X2 STER 10/PKG (GAUZE/BANDAGES/DRESSINGS) ×2
SPONGE LAP 18X18 RF (DISPOSABLE) ×3 IMPLANT
SPONGE LAP 4X18 RFD (DISPOSABLE) ×3 IMPLANT
STAPLER VISISTAT 35W (STAPLE) ×3 IMPLANT
SUCTION FRAZIER HANDLE 10FR (MISCELLANEOUS) ×2
SUCTION TUBE FRAZIER 10FR DISP (MISCELLANEOUS) ×1 IMPLANT
SUT MNCRL AB 3-0 PS2 18 (SUTURE) ×2 IMPLANT
SUT STRATAFIX PDS+ 0 24IN (SUTURE) ×3 IMPLANT
SUT VIC AB 1 CT1 36 (SUTURE) ×3 IMPLANT
SUT VIC AB 2-0 CT1 27 (SUTURE) ×9
SUT VIC AB 2-0 CT1 TAPERPNT 27 (SUTURE) ×3 IMPLANT
TOWEL OR 17X26 10 PK STRL BLUE (TOWEL DISPOSABLE) ×6 IMPLANT
TOWER CARTRIDGE SMART MIX (DISPOSABLE) IMPLANT
TRAY FOLEY MTR SLVR 16FR STAT (SET/KITS/TRAYS/PACK) ×3 IMPLANT
TUBE KAMVAC SUCTION (TUBING) IMPLANT
WATER STERILE IRR 1000ML POUR (IV SOLUTION) ×3 IMPLANT
YANKAUER SUCT BULB TIP 10FT TU (MISCELLANEOUS) ×3 IMPLANT

## 2019-07-26 NOTE — Anesthesia Postprocedure Evaluation (Signed)
Anesthesia Post Note  Patient: LINZI OHLINGER  Procedure(s) Performed: POSTERIOR HEAD/BALL LINER EXCHANGE LEFT HIP (Left Hip)     Patient location during evaluation: PACU Anesthesia Type: General Level of consciousness: awake and alert and oriented Pain management: pain level controlled Vital Signs Assessment: post-procedure vital signs reviewed and stable Respiratory status: spontaneous breathing, nonlabored ventilation and respiratory function stable Cardiovascular status: blood pressure returned to baseline Postop Assessment: no apparent nausea or vomiting Anesthetic complications: no    Last Vitals:  Vitals:   07/26/19 0516 07/26/19 1018  BP: 108/74 (!) 128/94  Pulse: 74   Resp: 14 13  Temp: 36.9 C (!) 35.6 C  SpO2: 97% 100%    Last Pain:  Vitals:   07/26/19 0516  TempSrc: Oral  PainSc:                  Brennan Bailey

## 2019-07-26 NOTE — Plan of Care (Signed)
  Problem: Clinical Measurements: Goal: Respiratory complications will improve Outcome: Progressing   Problem: Clinical Measurements: Goal: Cardiovascular complication will be avoided Outcome: Progressing   Problem: Coping: Goal: Level of anxiety will decrease Outcome: Progressing   Problem: Pain Managment: Goal: General experience of comfort will improve Outcome: Progressing   

## 2019-07-26 NOTE — Transfer of Care (Signed)
Immediate Anesthesia Transfer of Care Note  Patient: [name]  Procedure(s) Performed: POSTERIOR HEAD/BALL LINER EXCHANGE LEFT HIP (Left Hip)  Patient Location: PACU  Anesthesia Type:General  Level of Consciousness: awake, alert , oriented and patient cooperative  Airway & Oxygen Therapy: Patient Spontanous Breathing, Patient connected to nasal cannula oxygen and Patient connected to face mask  Post-op Assessment: Report given to RN, Post -op Vital signs reviewed and stable and Patient moving all extremities  Post vital signs: Reviewed and stable  Last Vitals:  Vitals Value Taken Time  BP 128/94 07/26/19 1018  Temp    Pulse 59 07/26/19 1022  Resp 7 07/26/19 1021  SpO2 100 % 07/26/19 1022  Vitals shown include unvalidated device data.  Last Pain:  Vitals:   07/26/19 0516  TempSrc: Oral  PainSc:       Patients Stated Pain Goal: 2 (42/35/36 1443)  Complications: No apparent anesthesia complications

## 2019-07-26 NOTE — Interval H&P Note (Signed)
History and Physical Interval Note:  07/26/2019 7:52 AM  [name]  has presented today for surgery, with the diagnosis of chronic dislocation left total hip.  The various methods of treatment have been discussed with the patient and family. After consideration of risks, benefits and other options for treatment, the patient has consented to  Procedure(s): POSTERIOR HEAD/BALL LINER EXCHANGE LEFT HIP (Left) as a surgical intervention.  The patient's history has been reviewed, patient examined, no change in status, stable for surgery.  I have reviewed the patient's chart and labs.  Questions were answered to the patient's satisfaction.     Mauri Pole

## 2019-07-26 NOTE — Progress Notes (Signed)
Pt back from PACU in stable condition. No needs at time of return to floor other than c/o pain. Rn will monitor and give pain medication safely and appropriately.

## 2019-07-26 NOTE — Progress Notes (Signed)
PROGRESS NOTE    [name]  [id number] DOB: 1960-12-13 DOA: 07/24/2019 PCP: Patient, No Pcp Per   Brief Narrative:  58 year old lady with prior history of hypothyroidism, history of substance abuse disorder left THA on 04/10/2010 had dislocation about 2 months ago at home.  Patient came into ER couple of times for hip pain but was recommended to follow-up with orthopedics as outpatient but she reports missing appointments with surgery due to family issues.   This admission,  orthopedic surgery consulted and plan for OR/surgical repair  Today.   Assessment & Plan:   Principal Problem:   Dislocated hip, left, sequela Active Problems:   Polysubstance abuse (HCC)   Hypokalemia   Anemia of chronic disease  Dislocation of the left hip Patient missed many appointments for elective surgery and had to come to ED for persistent pain.  Orthopedic surgery consulted and plan for left hip revision today. Pain controlled. No new events.    History of substance abuse disorder Counseling provided Continue with home medications.   Hypothyroidism Continue with Synthroid. TSH greater than 285 probably patient is noncompliant to medications. Free t4 is pending.     Anemia of chronic disease Transfuse to keep hemoglobin greater than 7 Anemia panel ordered and reviewed.  No signs of bleeding.   . Hypokalemia Replaced    DVT prophylaxis: SCDs Code Status: Full code Family Communication: None at bedside  Disposition:  Pending their evaluation by orthopedics   Consultants:   ORTHOPEDICS  Procedures: Left hip revision on 07/26/2019  Antimicrobials: None  Subjective:  Pain controlled. No new events.   Objective: Vitals:   07/25/19 1357 07/25/19 2156 07/26/19 0516 07/26/19 1018  BP: 113/76 115/75 108/74 (!) 128/94  Pulse: 71 68 74   Resp: 15 14 14 13   Temp: 98 F (36.7 C) 98.3 F (36.8 C) 98.4 F (36.9 C) (!) 96.1 F (35.6 C)  TempSrc: Oral Oral Oral   SpO2:  99% 98% 97% 100%  Weight:      Height:        Intake/Output Summary (Last 24 hours) at 07/26/2019 1037 Last data filed at 07/26/2019 1022 Gross per 24 hour  Intake 2130 ml  Output 600 ml  Net 1530 ml   Filed Weights   07/24/19 2050  Weight: 53 kg    Examination:  General exam: alert and comfortable.  Respiratory system: clear to auscultation, no wheezin gor rhonchi.  Cardiovascular system: S1-S2 heard, regular rate rhythm, no JVD Gastrointestinal system: Abdomen is soft, nontender, nondistended, bowel sounds normal . Central nervous system: Alert and oriented, grossly nonfocal Extremities: No pedal edema Skin: No rashes Psychiatry: Mood appropriate    Data Reviewed: I have personally reviewed following labs and imaging studies  CBC: Recent Labs  Lab 07/24/19 1402 07/25/19 0239 07/25/19 2043  WBC 5.7 5.7 5.8  HGB 10.6* 9.7* 10.4*  HCT 34.9* 31.3* 34.2*  MCV 92.3 91.3 92.7  PLT 325 339 374   Basic Metabolic Panel: Recent Labs  Lab 07/24/19 1402 07/25/19 0239 07/26/19 0255  NA 140 141 141  K 3.3* 3.4* 4.2  CL 101 102 103  CO2 28 29 28   GLUCOSE 75 135* 108*  BUN 19 20 18   CREATININE 0.92 1.14* 1.03*  CALCIUM 9.6 9.2 9.3   GFR: Estimated Creatinine Clearance: 43.3 mL/min (A) (by C-G formula based on SCr of 1.03 mg/dL (H)). Liver Function Tests: No results for input(s): AST, ALT, ALKPHOS, BILITOT, PROT, ALBUMIN in the last 168 hours. No results  for input(s): LIPASE, AMYLASE in the last 168 hours. No results for input(s): AMMONIA in the last 168 hours. Coagulation Profile: No results for input(s): INR, PROTIME in the last 168 hours. Cardiac Enzymes: No results for input(s): CKTOTAL, CKMB, CKMBINDEX, TROPONINI in the last 168 hours. BNP (last 3 results) No results for input(s): PROBNP in the last 8760 hours. HbA1C: No results for input(s): HGBA1C in the last 72 hours. CBG: No results for input(s): GLUCAP in the last 168 hours. Lipid Profile: No  results for input(s): CHOL, HDL, LDLCALC, TRIG, CHOLHDL, LDLDIRECT in the last 72 hours. Thyroid Function Tests: Recent Labs    07/25/19 0239  TSH 285.379*   Anemia Panel: Recent Labs    07/26/19 0255  VITAMINB12 458  FOLATE 6.6  FERRITIN 168  TIBC 291  IRON 63  RETICCTPCT 0.9   Sepsis Labs: No results for input(s): PROCALCITON, LATICACIDVEN in the last 168 hours.  Recent Results (from the past 240 hour(s))  SARS CORONAVIRUS 2 (TAT 6-24 HRS) Nasopharyngeal Nasopharyngeal Swab     Status: None   Collection Time: 07/24/19  4:19 PM   Specimen: Nasopharyngeal Swab  Result Value Ref Range Status   SARS Coronavirus 2 NEGATIVE NEGATIVE Final    Comment: (NOTE) SARS-CoV-2 target nucleic acids are NOT DETECTED. The SARS-CoV-2 RNA is generally detectable in upper and lower respiratory specimens during the acute phase of infection. Negative results do not preclude SARS-CoV-2 infection, do not rule out co-infections with other pathogens, and should not be used as the sole basis for treatment or other patient management decisions. Negative results must be combined with clinical observations, patient history, and epidemiological information. The expected result is Negative. Fact Sheet for Patients: HairSlick.no Fact Sheet for Healthcare Providers: quierodirigir.com This test is not yet approved or cleared by the [place] FDA and  has been authorized for detection and/or diagnosis of SARS-CoV-2 by FDA under an Emergency Use Authorization (EUA). This EUA will remain  in effect (meaning this test can be used) for the duration of the COVID-19 declaration under Section 56 4(b)(1) of the Act, 21 U.S.C. section 360bbb-3(b)(1), unless the authorization is terminated or revoked sooner. Performed at [facility], [address] [place], [place] [address]   Surgical PCR screen     Status: Abnormal   Collection Time: 07/25/19   6:02 AM   Specimen: Nasal Mucosa; Nasal Swab  Result Value Ref Range Status   MRSA, PCR NEGATIVE NEGATIVE Final   Staphylococcus aureus POSITIVE (A) NEGATIVE Final    Comment: (NOTE) The Xpert SA Assay (FDA approved for NASAL specimens in patients 24 years of age and older), is one component of a comprehensive surveillance program. It is not intended to diagnose infection nor to guide or monitor treatment. Performed at [facility], [address] [place], [place] [address]          Radiology Studies: Dg Chest Port 1 View  Result Date: 07/24/2019 CLINICAL DATA:  History of hip dislocation.  Preoperative exam. EXAM: PORTABLE CHEST 1 VIEW COMPARISON:  Single-view of the chest 12/13/2008. FINDINGS: Lungs clear. Heart size normal. Atherosclerosis. No pneumothorax or pleural effusion. No acute bony abnormality. Remote lower right rib fracture noted. IMPRESSION: No acute disease. Electronically Signed   By: Drusilla Kanner M.D.   On: 07/24/2019 13:51   Dg Hip Unilat W Or Wo Pelvis 2-3 Views Left  Result Date: 07/24/2019 CLINICAL DATA:  Left hip pain. History of dislocation. No recent injury. EXAM: DG HIP (  WITH OR WITHOUT PELVIS) 2-3V LEFT COMPARISON:  Plain films left hip 07/11/2019. FINDINGS: The patient has a left hip arthroplasty. The device is posteriorly, superiorly dislocated. Postoperative change of fixation of left acetabular fractures noted. No acute fracture. IMPRESSION: Dislocated left hip prosthesis Electronically Signed   By: Inge Rise M.D.   On: 07/24/2019 13:53        Scheduled Meds: . feeding supplement  296 mL Oral Once  . [MAR Hold] levothyroxine  50 mcg Oral QAC breakfast   Continuous Infusions: . acetaminophen    . acetaminophen    . methocarbamol (ROBAXIN) IV       LOS: 2 days       Hosie Poisson, MD [facility]  07/26/2019, 10:37 AM

## 2019-07-26 NOTE — Anesthesia Procedure Notes (Signed)
Procedure Name: Intubation Date/Time: 07/26/2019 8:16 AM Performed by: Brennan Bailey, MD Pre-anesthesia Checklist: Patient identified, Emergency Drugs available, Suction available and Patient being monitored Patient Re-evaluated:Patient Re-evaluated prior to induction Oxygen Delivery Method: Circle System Utilized Preoxygenation: Pre-oxygenation with 100% oxygen Induction Type: IV induction Ventilation: Mask ventilation without difficulty Laryngoscope Size: Miller and 2 Grade View: Grade I Tube type: Oral Tube size: 7.0 mm Number of attempts: 2 Airway Equipment and Method: Stylet Placement Confirmation: ETT inserted through vocal cords under direct vision,  positive ETCO2 and breath sounds checked- equal and bilateral Secured at: 21 cm Tube secured with: Tape Dental Injury: Teeth and Oropharynx as per pre-operative assessment  Comments: First attempt by CRNA with Mac3 unable to obtain adequate view of glottis. Second attempt by myself with Sabra Heck 2 with grade 1 view. Somewhat small mouth opening noted. Daiva Huge, MD

## 2019-07-26 NOTE — Brief Op Note (Signed)
07/24/2019 - 07/26/2019  8:00 AM  PATIENT:  [name]  58 y.o. 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   Delay start of Pharmacological VTE agent (>24hrs) due to surgical blood loss or risk of bleeding: no

## 2019-07-27 LAB — CBC
HCT: 29.2 % — ABNORMAL LOW (ref 36.0–46.0)
Hemoglobin: 9.2 g/dL — ABNORMAL LOW (ref 12.0–15.0)
MCH: 28.4 pg (ref 26.0–34.0)
MCHC: 31.5 g/dL (ref 30.0–36.0)
MCV: 90.1 fL (ref 80.0–100.0)
Platelets: 343 10*3/uL (ref 150–400)
RBC: 3.24 MIL/uL — ABNORMAL LOW (ref 3.87–5.11)
RDW: 15.9 % — ABNORMAL HIGH (ref 11.5–15.5)
WBC: 10 10*3/uL (ref 4.0–10.5)
nRBC: 0 % (ref 0.0–0.2)

## 2019-07-27 LAB — BASIC METABOLIC PANEL
Anion gap: 10 (ref 5–15)
BUN: 14 mg/dL (ref 6–20)
CO2: 24 mmol/L (ref 22–32)
Calcium: 8.7 mg/dL — ABNORMAL LOW (ref 8.9–10.3)
Chloride: 104 mmol/L (ref 98–111)
Creatinine, Ser: 1.22 mg/dL — ABNORMAL HIGH (ref 0.44–1.00)
GFR calc Af Amer: 57 mL/min — ABNORMAL LOW (ref >60)
GFR calc non Af Amer: 49 mL/min — ABNORMAL LOW (ref >60)
Glucose, Bld: 95 mg/dL (ref 70–99)
Potassium: 4.1 mmol/L (ref 3.5–5.1)
Sodium: 138 mmol/L (ref 135–145)

## 2019-07-27 LAB — T4, FREE
Free T4: <0.25 ng/dL — ABNORMAL LOW (ref 0.61–1.12)
Free T4: 0.29 ng/dL — ABNORMAL LOW (ref 0.61–1.12)

## 2019-07-27 MED ORDER — OXYCODONE HCL 5 MG PO TABS
5.0000 mg | ORAL_TABLET | ORAL | Status: DC | PRN
  Administered 2019-07-27 – 2019-07-28 (×8): 10 mg via ORAL
  Filled 2019-07-27 (×8): qty 2

## 2019-07-27 NOTE — Care Management Important Message (Signed)
Important Message  Patient Details IM Letter given to Kathrin Greathouse SW to present to the Patient Name: [name] MRN: [id number] Date of Birth: 14-Oct-1960   Medicare Important Message Given:  Yes     Kerin Salen 07/27/2019, 12:46 PM

## 2019-07-27 NOTE — Evaluation (Addendum)
Physical Therapy Evaluation Patient Details Name: [name] MRN: [id number] DOB: [birth year] Today's Date: 07/27/2019   History of Present Illness  Pt is 58 yo female who presented to the ED with L hip dislocation (has been ongoing issue for a couple months) and is now s/p revision of L THA on 07/26/19 with posterior precautions and WBAT.  PMH includes L THA 04/10/10, anemia, COPD, depression, hypothyroidism, and bil ORIF of patellas.  Clinical Impression  Pt is s/p L THA revision resulting in the deficits listed below (see PT Problem List). Pt has been dealing with L hip dislocation for a couple months and has learned compensation techniques to manage at home.  She was able to complete transfers with min A; however, needed frequent cues for posterior hip precautions.  She ambulated 15'x2 with cues for sequencing and weight bearing on L LE.  Will need continued education and cues for hip precautions. Pt will benefit from skilled PT to increase their independence and safety with mobility to allow discharge to the venue listed below.      Follow Up Recommendations Home health PT;Supervision/Assistance - 24 hour    Equipment Recommendations  Rolling walker with 5" wheels    Recommendations for Other Services       Precautions / Restrictions Precautions Precautions: Posterior Hip;Fall Precaution Booklet Issued: Yes (comment) Restrictions LLE Weight Bearing: Weight bearing as tolerated      Mobility  Bed Mobility Overal bed mobility: Needs Assistance Bed Mobility: Supine to Sit     Supine to sit: Min assist;HOB elevated     General bed mobility comments: used bed rails, cues for precautions, pt used UE to assist L LE  Transfers Overall transfer level: Needs assistance Equipment used: Rolling walker (2 wheeled) Transfers: Sit to/from Stand Sit to Stand: Min guard         General transfer comment: cues for safe hand placement and  precautions  Ambulation/Gait Ambulation/Gait assistance: Min guard Gait Distance (Feet): 15 Feet Assistive device: Rolling walker (2 wheeled) Gait Pattern/deviations: Step-to pattern;Decreased stride length;Decreased weight shift to left     General Gait Details: 15'x2, declined further due to pain, pt initially hopping but with cues would put L toes on ground and take step but with limited weight on L LE and no heel strike due to pain  Stairs            Wheelchair Mobility    Modified Rankin (Stroke Patients Only)       Balance Overall balance assessment: Needs assistance Sitting-balance support: Bilateral upper extremity supported;Feet supported Sitting balance-Leahy Scale: Fair     Standing balance support: Bilateral upper extremity supported;During functional activity Standing balance-Leahy Scale: Good                               Pertinent Vitals/Pain Pain Assessment: 0-10 Pain Score: 9  Pain Location: L hip Pain Descriptors / Indicators: Sharp;Stabbing Pain Intervention(s): Limited activity within patient's tolerance;Patient requesting pain meds-RN notified    Home Living Family/patient expects to be discharged to:: Unsure Living Arrangements: Children Available Help at Discharge: Family;Available PRN/intermittently Type of Home: Other(Comment)(staying in hotel) Home Access: Stairs to enter Entrance Stairs-Rails: Right;Left(can only reach 1 side) Entrance Stairs-Number of Steps: flight Home Layout: Other (Comment)(staying in a motel)   Additional Comments: none reported    Prior Function           Comments: Prior to hip dislocation 2 months pt  able to ambulate and was independent.  Since hip dislocation using a walker for short distances in her room, could do bathing and dressing.  Pt providing limited history.     Hand Dominance        Extremity/Trunk Assessment   Upper Extremity Assessment Upper Extremity Assessment: Overall  WFL for tasks assessed    Lower Extremity Assessment Lower Extremity Assessment: LLE deficits/detail LLE Deficits / Details: ROM: ankle and knee WFL, hip within precautions; MMT: ankle 5/5, knee 3/5 not further tested, hip 1/5 not further tested    Cervical / Trunk Assessment Cervical / Trunk Assessment: Normal  Communication   Communication: No difficulties  Cognition Arousal/Alertness: Lethargic Behavior During Therapy: Flat affect Overall Cognitive Status: Within Functional Limits for tasks assessed                                        General Comments General comments (skin integrity, edema, etc.): Upon arrival pt laying on her R side without pillow between knees and with her L (surgical) leg in an abducted position.  She was educated on hip precautions.  Increased time spent on hip precautions with all transfers and rest  and gave pt handout.    Exercises     Assessment/Plan    PT Assessment Patient needs continued PT services  PT Problem List Decreased strength;Decreased mobility;Decreased safety awareness;Decreased range of motion;Decreased knowledge of precautions;Decreased activity tolerance;Decreased balance;Decreased knowledge of use of DME       PT Treatment Interventions DME instruction;Therapeutic activities;Gait training;Therapeutic exercise;Patient/family education;Stair training;Balance training;Functional mobility training    PT Goals (Current goals can be found in the Care Plan section)  Acute Rehab PT Goals Patient Stated Goal: decrease pain PT Goal Formulation: With patient Time For Goal Achievement: 08/10/19 Potential to Achieve Goals: Good    Frequency 7X/week   Barriers to discharge Inaccessible home environment;Decreased caregiver support (staying at hotel)      Co-evaluation               AM-PAC PT "6 Clicks" Mobility  Outcome Measure Help needed turning from your back to your side while in a flat bed without using  bedrails?: A Little Help needed moving from lying on your back to sitting on the side of a flat bed without using bedrails?: A Little Help needed moving to and from a bed to a chair (including a wheelchair)?: A Little Help needed standing up from a chair using your arms (e.g., wheelchair or bedside chair)?: None Help needed to walk in hospital room?: None Help needed climbing 3-5 steps with a railing? : A Lot 6 Click Score: 19    End of Session Equipment Utilized During Treatment: Gait belt Activity Tolerance: Patient tolerated treatment well Patient left: in bed;with bed alarm set;with call bell/phone within reach(pillow between knees; hip precaution handout and wrote on whiteboard) Nurse Communication: Mobility status;Precautions PT Visit Diagnosis: Other abnormalities of gait and mobility (R26.89);Muscle weakness (generalized) (M62.81)    Time: 1110-1140 PT Time Calculation (min) (ACUTE ONLY): 30 min   Charges:   PT Evaluation $PT Eval Moderate Complexity: 1 Mod PT Treatments $Gait Training: 8-22 mins        Royetta Asal, PT Acute Rehab Services Pager [phone] [facility] [phone] [facility] [phone]   Rayetta Humphrey 07/27/2019, 1:22 PM

## 2019-07-27 NOTE — Progress Notes (Signed)
PROGRESS NOTE    Abbey ChattersDonna J Freiermuth  NWG:[id number] DOB: 1960-12-08 DOA: 07/24/2019 PCP: Patient, No Pcp Per   Brief Narrative:  58 year old lady with prior history of hypothyroidism, history of substance abuse disorder left THA on 04/10/2010 had dislocation about 2 months ago at home.  Patient came into ER couple of times for hip pain but was recommended to follow-up with orthopedics as outpatient but she reports missing appointments with surgery due to family issues.   This admission,  orthopedic surgery consulted and she underwent revision of the THA by Dr Charlann Boxerlin on 07/26/19. Pt seen and examined today. She appears to be in good spirits.  She reports not taking her medications for over an year which would explain her elevated TSH levels.  PT evaluation recommending home health PT. Possible d/c in the next 24 hours when cleared by orthopedics.   Assessment & Plan:   Principal Problem:   Dislocated hip, left, sequela Active Problems:   Polysubstance abuse (HCC)   Hypokalemia   Anemia of chronic disease  Dislocation of the left hip Patient missed many appointments for elective surgery and had to come to ED for persistent pain.  Orthopedic surgery consulted , underwent revision of the left THA on 07/26/19 by Dr Charlann Boxerlin. PT eval recommending home health PT which will be ordered . Pain sub optimally controlled.    History of substance abuse disorder Counseling provided Continue with home medications.   Hypothyroidism Continue with Synthroid.pt reports , she hasn't taken her meds for more than a year.  TSH greater than 285 probably patient is noncompliant to medications.  Anemia of chronic disease/ mild anemia of blood loss ? From the surgery.  Transfuse to keep hemoglobin greater than 7 Anemia panel ordered and reviewed.  No signs of bleeding.   . Hypokalemia Replaced  Mild AKI:  Possibly from dehydration.  Hydrate and repeat renal parameters in am.    DVT prophylaxis: SCDs Code  Status: Full code Family Communication: None at bedside  Disposition:  Pending their evaluation by orthopedics   Consultants:   ORTHOPEDICS  Procedures: Left hip revision on 07/26/2019  Antimicrobials: on dose of cefazolin   Subjective:  Pain not well controlled. No nausea,vomiting or abdominal pain.  No chest pain or sob.   Objective: Vitals:   07/27/19 0123 07/27/19 0539 07/27/19 0930 07/27/19 1409  BP: (!) 120/94 113/89 131/84 (!) 134/98  Pulse: 68 66 69 71  Resp: 18 16 16 16   Temp: 98 F (36.7 C) 98 F (36.7 C) 98.4 F (36.9 C) 99 F (37.2 C)  TempSrc:   Oral Oral  SpO2: 96% 96% 94% 97%  Weight:      Height:        Intake/Output Summary (Last 24 hours) at 07/27/2019 1545 Last data filed at 07/27/2019 0905 Gross per 24 hour  Intake 2171.84 ml  Output 1850 ml  Net 321.84 ml   Filed Weights   07/24/19 2050  Weight: 53 kg    Examination:  General exam: not in distress, alert and comfortable.  Respiratory system: clear to auscultation bilaterally, no wheezing or rhonchi.  Cardiovascular system: S1S2, RRR no JVD.  Gastrointestinal system: abdomen is soft, non tender non distended, bowel sounds normal.  . Central nervous system: alert and non focal Extremities: no pedal edema.  Skin: no rashes.  Psychiatry: mood appropriate.     Data Reviewed: I have personally reviewed following labs and imaging studies  CBC: Recent Labs  Lab 07/24/19 1402 07/25/19 0239 07/25/19  2043 07/27/19 0405  WBC 5.7 5.7 5.8 10.0  HGB 10.6* 9.7* 10.4* 9.2*  HCT 34.9* 31.3* 34.2* 29.2*  MCV 92.3 91.3 92.7 90.1  PLT 325 339 374 841   Basic Metabolic Panel: Recent Labs  Lab 07/24/19 1402 07/25/19 0239 07/26/19 0255 07/27/19 0405  NA 140 141 141 138  K 3.3* 3.4* 4.2 4.1  CL 101 102 103 104  CO2 28 29 28 24   GLUCOSE 75 135* 108* 95  BUN 19 20 18 14   CREATININE 0.92 1.14* 1.03* 1.22*  CALCIUM 9.6 9.2 9.3 8.7*   GFR: Estimated Creatinine Clearance: 36.5 mL/min (A)  (by C-G formula based on SCr of 1.22 mg/dL (H)). Liver Function Tests: No results for input(s): AST, ALT, ALKPHOS, BILITOT, PROT, ALBUMIN in the last 168 hours. No results for input(s): LIPASE, AMYLASE in the last 168 hours. No results for input(s): AMMONIA in the last 168 hours. Coagulation Profile: No results for input(s): INR, PROTIME in the last 168 hours. Cardiac Enzymes: No results for input(s): CKTOTAL, CKMB, CKMBINDEX, TROPONINI in the last 168 hours. BNP (last 3 results) No results for input(s): PROBNP in the last 8760 hours. HbA1C: No results for input(s): HGBA1C in the last 72 hours. CBG: No results for input(s): GLUCAP in the last 168 hours. Lipid Profile: No results for input(s): CHOL, HDL, LDLCALC, TRIG, CHOLHDL, LDLDIRECT in the last 72 hours. Thyroid Function Tests: Recent Labs    07/25/19 0239  TSH 285.379*   Anemia Panel: Recent Labs    07/26/19 0255  VITAMINB12 458  FOLATE 6.6  FERRITIN 168  TIBC 291  IRON 63  RETICCTPCT 0.9   Sepsis Labs: No results for input(s): PROCALCITON, LATICACIDVEN in the last 168 hours.  Recent Results (from the past 240 hour(s))  SARS CORONAVIRUS 2 (TAT 6-24 HRS) Nasopharyngeal Nasopharyngeal Swab     Status: None   Collection Time: 07/24/19  4:19 PM   Specimen: Nasopharyngeal Swab  Result Value Ref Range Status   SARS Coronavirus 2 NEGATIVE NEGATIVE Final    Comment: (NOTE) SARS-CoV-2 target nucleic acids are NOT DETECTED. The SARS-CoV-2 RNA is generally detectable in upper and lower respiratory specimens during the acute phase of infection. Negative results do not preclude SARS-CoV-2 infection, do not rule out co-infections with other pathogens, and should not be used as the sole basis for treatment or other patient management decisions. Negative results must be combined with clinical observations, patient history, and epidemiological information. The expected result is Negative. Fact Sheet for Patients:  SugarRoll.be Fact Sheet for Healthcare Providers: https://www.woods-mathews.com/ This test is not yet approved or cleared by the [place] FDA and  has been authorized for detection and/or diagnosis of SARS-CoV-2 by FDA under an Emergency Use Authorization (EUA). This EUA will remain  in effect (meaning this test can be used) for the duration of the COVID-19 declaration under Section 56 4(b)(1) of the Act, 21 U.S.C. section 360bbb-3(b)(1), unless the authorization is terminated or revoked sooner. Performed at [facility], [place] [address], [place], [place] [address]   Surgical PCR screen     Status: Abnormal   Collection Time: 07/25/19  6:02 AM   Specimen: Nasal Mucosa; Nasal Swab  Result Value Ref Range Status   MRSA, PCR NEGATIVE NEGATIVE Final   Staphylococcus aureus POSITIVE (A) NEGATIVE Final    Comment: (NOTE) The Xpert SA Assay (FDA approved for NASAL specimens in patients 42 years of age and older), is one component of a comprehensive surveillance program. It is  not intended to diagnose infection nor to guide or monitor treatment. Performed at [facility], [address], [place], [place] [address]          Radiology Studies: Dg Pelvis Portable  Result Date: 07/26/2019 CLINICAL DATA:  Status post placement of a new femoral head and acetabular liner. EXAM: PORTABLE PELVIS 1-2 VIEWS COMPARISON:  07/24/2011 FINDINGS: New larger femoral head prosthesis. No dislocation. No hip or pelvic fractures. IMPRESSION: New larger femoral head prosthesis. No dislocation or fractures. Electronically Signed   By: Rudie Meyer M.D.   On: 07/26/2019 12:38        Scheduled Meds: . aspirin  81 mg Oral BID  . Chlorhexidine Gluconate Cloth  6 each Topical Daily  . docusate sodium  100 mg Oral BID  . ferrous sulfate  325 mg Oral TID PC  . levothyroxine  50 mcg Oral QAC breakfast  . polyethylene glycol  17 g Oral  BID   Continuous Infusions: . sodium chloride 100 mL/hr at 07/27/19 1310  . methocarbamol (ROBAXIN) IV Stopped (07/26/19 1204)     LOS: 3 days       Kathlen Mody, MD [facility]  07/27/2019, 3:45 PM

## 2019-07-27 NOTE — Op Note (Addendum)
NAME: [name], [name] MEDICAL RECORD [id number] ACCOUNT [id number] DATE OF BIRTH:1961-04-12 FACILITY: [facility] LOCATION: [id number] PHYSICIAN:Tanelle Lanzo Rosalia Hammers, MD  OPERATIVE REPORT  DATE OF PROCEDURE:  07/26/2019  PREOPERATIVE DIAGNOSIS:  Subacute/chronic dislocation of a left total hip replacement.  POSTOPERATIVE DIAGNOSIS:  Subacute/chronic dislocation of a left total hip replacement.  PROCEDURE:  Revision left total hip arthroplasty.  COMPONENTS USED:  DePuy acetabular polyethylene liner, size 54 x 36+4, 10-degree face-changing liner, with a 36+8.5 delta ceramic ball.  SURGEON:  Durene Romans, MD  ASSISTANT:  Lanney Gins, PA-C.  Note that [name] was present for the entirety of the case from preoperative positioning, perioperative management of the operative extremity, general facilitation of the case and primary wound closure.  ANESTHESIA:  General.  BLOOD LOSS:  About 100 mL.  DRAINS:  None.  COMPLICATIONS:  None.  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  In 2011, she underwent a total  hip arthroplasty for this.  She has been in a normal state of health until approximately 2 months ago, per her report.  She had a dislocation of her left hip.  She was seen in our office approximately 3 weeks ago and was scheduled to go to the operating  room that night for closed reduction.  However, she stated that she had to go home or else her daughter would have lost her job.  I subsequently set her up for surgery for an open versus closed reduction of the left hip on the following Tuesday.  She did  not show up for that procedure.  She subsequently showed up to the emergency room on Friday, 07/24/2019, with the persistent dislocated left hip.  She was admitted to the hospitalist service.  At this point, given the  duration of time, the plan was for  an open procedure with possible revision of the components either to a less stabilized or a constrained liner.  Consent was obtained for benefit of management of her left hip.  DESCRIPTION OF PROCEDURE:  The patient was brought into the operative theater.  Once adequate anesthesia, preoperative antibiotics, Ancef administered as well as tranexamic acid and Decadron, she was positioned into the right lateral decubitus position  with the left hip up.  The left lower extremity was prepped and draped in sterile fashion.  Timeout was performed identifying the patient, the planned procedure and extremity.  Her old incision was excised.  Soft tissue planes created.  Posterior  approach to the hip was carried out through the iliotibial band and gluteal fascia.  The posterior aspect of the hip was exposed noting a minimal posttraumatic seroma, but significant synovitic response throughout the hip capsule region.  Carefully, this  synovial tissue was excised for exposure of the acetabulum.  Once I was able to do this, I did reduce the hip so I could assess her motion.  With the 32+9 ball in, when she had her hip flexed/internally rotated, I could get some leveraging anteriorly.   Her combined anteversion appeared to be less than 45-50 degrees.  For this reason, I dislocated the hip, removed the femoral head.  I then placed the femur anteriorly.  I removed the old acetabular liner.  I did a trial reduction with a 36+4 liner again  and found that the combined anteversion was not adequate.  I felt that the stability of  the hip could be maintained and improved with a larger head ball, as well as a lip liner, as opposed to going to a constrained system.  I then irrigated the hip out  with normal saline solution.  We selected a 36+4, 10-degree face-changing liner.  This was opened and then positioned at approximately the 2 o'clock position and impacted with good visualized rim fit.  I  then retrialed and felt that with her issues, as  well as her exam on the OR table, that the 36+8.5 ball provided the best stability.  The final 36+5 delta ceramic ball was opened and impacted on a clean and dried trunnion and the hip was reduced.  We irrigated the hip again.  The iliotibial band and  gluteal fascia were reapproximated using #1 Vicryl and #1 Stratafix suture.  The remainder of the wound was closed with 2-0 Vicryl and a running Monocryl stitch.  The hip was then cleaned, dried and dressed sterilely using surgical glue and Aquacel  dressing.  The patient was brought to the recovery room in stable condition tolerating the procedure well.  Findings were reviewed with her cousin.  JN/NUANCE  D:07/26/2019 T:07/27/2019 JOB:[id number]

## 2019-07-28 ENCOUNTER — Encounter (HOSPITAL_COMMUNITY): Payer: Self-pay | Admitting: Orthopedic Surgery

## 2019-07-28 LAB — BASIC METABOLIC PANEL
Anion gap: 9 (ref 5–15)
BUN: 13 mg/dL (ref 6–20)
CO2: 27 mmol/L (ref 22–32)
Calcium: 9.2 mg/dL (ref 8.9–10.3)
Chloride: 105 mmol/L (ref 98–111)
Creatinine, Ser: 0.94 mg/dL (ref 0.44–1.00)
GFR calc Af Amer: >60 mL/min (ref >60)
GFR calc non Af Amer: >60 mL/min (ref >60)
Glucose, Bld: 88 mg/dL (ref 70–99)
Potassium: 4 mmol/L (ref 3.5–5.1)
Sodium: 141 mmol/L (ref 135–145)

## 2019-07-28 LAB — CBC
HCT: 30.6 % — ABNORMAL LOW (ref 36.0–46.0)
Hemoglobin: 9.4 g/dL — ABNORMAL LOW (ref 12.0–15.0)
MCH: 28.6 pg (ref 26.0–34.0)
MCHC: 30.7 g/dL (ref 30.0–36.0)
MCV: 93 fL (ref 80.0–100.0)
Platelets: 354 10*3/uL (ref 150–400)
RBC: 3.29 MIL/uL — ABNORMAL LOW (ref 3.87–5.11)
RDW: 16.1 % — ABNORMAL HIGH (ref 11.5–15.5)
WBC: 11 10*3/uL — ABNORMAL HIGH (ref 4.0–10.5)
nRBC: 0 % (ref 0.0–0.2)

## 2019-07-28 MED ORDER — ASPIRIN 81 MG PO CHEW: 81.0000 mg | CHEWABLE_TABLET | Freq: Two times a day (BID) | ORAL | 0 refills | Status: AC

## 2019-07-28 MED ORDER — POLYETHYLENE GLYCOL 3350 17 G PO PACK: 17.0000 g | PACK | Freq: Two times a day (BID) | ORAL | 0 refills | Status: DC

## 2019-07-28 MED ORDER — METHOCARBAMOL 500 MG PO TABS: 500.0000 mg | ORAL_TABLET | Freq: Four times a day (QID) | ORAL | 0 refills | Status: DC | PRN

## 2019-07-28 MED ORDER — OXYCODONE HCL 5 MG PO TABS: 5.0000 mg | ORAL_TABLET | ORAL | 0 refills | Status: DC | PRN

## 2019-07-28 MED ORDER — ACETAMINOPHEN 500 MG PO TABS: 1000.0000 mg | ORAL_TABLET | Freq: Three times a day (TID) | ORAL | 0 refills | Status: DC

## 2019-07-28 MED ORDER — LEVOTHYROXINE SODIUM 50 MCG PO TABS: 50.0000 ug | ORAL_TABLET | Freq: Every day | ORAL | 2 refills | Status: DC

## 2019-07-28 MED ORDER — DOCUSATE SODIUM 100 MG PO CAPS: 100.0000 mg | ORAL_CAPSULE | Freq: Two times a day (BID) | ORAL | 0 refills | Status: DC

## 2019-07-28 MED ORDER — FERROUS SULFATE 325 (65 FE) MG PO TABS: 325.0000 mg | ORAL_TABLET | Freq: Three times a day (TID) | ORAL | 0 refills | Status: DC

## 2019-07-28 NOTE — Discharge Summary (Signed)
Physician Discharge Summary  [name] QMV:[id number] DOB: [birth year] DOA: 07/24/2019  PCP: Patient, No Pcp Per  Admit date: 07/24/2019 Discharge date: 12/8//2020  Admitted From: Home.  Disposition: Home.   Recommendations for Outpatient Follow-up:  1. Follow up with PCP in 1-2 weeks 2. Please obtain BMP/CBC in one week 3. Please follow up with Orthopedics as recommended.   Home Health:yes  Discharge Condition:stable.  CODE STATUS: full code.  Diet recommendation: Heart Healthy  Brief/Interim Summary:  58 year old lady with prior history of hypothyroidism, history of substance abuse disorder left THA on 04/10/2010 had dislocation about 2 months ago at home.  Patient came into ER couple of times for hip pain but was recommended to follow-up with orthopedics as outpatient but she reports missing appointments with surgery due to family issues.   This admission,  orthopedic surgery consulted and she underwent revision of the THA by Dr Charlann Boxer on 07/26/19. Pt seen and examined today. She appears to be in good spirits.  She reports not taking her medications for over an year which would explain her elevated TSH levels.  PT evaluation recommending home health PT.    Dislocation of the left hip Patient missed many appointments for elective surgery and had to come to ED for persistent pain.  Orthopedic surgery consulted , underwent revision of the left THA on 07/26/19 by Dr Charlann Boxer. PT eval recommending home health PT which will be ordered . Pain better controlled.  As per orthopedics   ASA 81 mg bid for 4 weeks for anticoagulation, unless other medically indicated.  Oxycodone for pain management (Rx written).  MiraLax and Colace for constipation  Iron 325 mg tid for 2-3 weeks   WBAT on the left leg.  Posterior hip precautions  Dressing to remain in place until follow in clinic in 2 weeks.  Dressing is waterproof and may shower with it in place.  Follow up in 2 weeks at [facility]. Follow up with OLIN,MATTHEW D in 2 weeks.      History of substance abuse disorder Counseling provided Continue with home medications.   Hypothyroidism Continue with Synthroid.pt reports , she hasn't taken her meds for more than a year.  TSH greater than 285 probably patient is noncompliant to medications. Recommend checking thyroid panel in 4 to 6 weeks.   Anemia of chronic disease/ mild anemia of blood loss ? From the surgery.  Transfuse to keep hemoglobin greater than 7 Anemia panel ordered and reviewed.  No signs of bleeding.   . Hypokalemia Replaced  Mild AKI:  Possibly from dehydration.  Baseline creatinine at 0.9 and creatinine increased to >1.2, after hydration, creatinine improved to 0.9.     Discharge Diagnoses:  Principal Problem:   Dislocated hip, left, sequela Active Problems:   Polysubstance abuse (HCC)   Hypokalemia   Anemia of chronic disease    Discharge Instructions  Discharge Instructions    Diet - low sodium heart healthy   Complete by: As directed    Discharge instructions   Complete by: As directed    FOLLOW up with PCP in 1 to 2 weeks  Please follow up with Orthopedics as recommended.   Posterior total hip precautions   Complete by: As directed    Weight bearing as tolerated   Complete by: As directed    Laterality: left   Extremity: Lower     Allergies as of 07/28/2019      Reactions   Methocarbamol Swelling   Mobic [meloxicam] Swelling  Darvon [propoxyphene] Hives, Itching, Rash      Medication List    STOP taking these medications   HYDROcodone-acetaminophen 5-325 MG tablet Commonly known as: NORCO/VICODIN   ibuprofen 200 MG tablet Commonly known as: ADVIL     TAKE these medications   acetaminophen 500 MG tablet Commonly known as: TYLENOL Take 2 tablets (1,000 mg total) by mouth every 8 (eight) hours.   aspirin 81 MG chewable tablet Commonly known as: Aspirin Childrens Chew 1 tablet (81 mg  total) by mouth 2 (two) times daily. Take for 4 weeks, then resume regular dose.   docusate sodium 100 MG capsule Commonly known as: Colace Take 1 capsule (100 mg total) by mouth 2 (two) times daily.   ferrous sulfate 325 (65 FE) MG tablet Commonly known as: FerrouSul Take 1 tablet (325 mg total) by mouth 3 (three) times daily with meals for 14 days.   levothyroxine 50 MCG tablet Commonly known as: SYNTHROID Take 1 tablet (50 mcg total) by mouth daily before breakfast.   methocarbamol 500 MG tablet Commonly known as: Robaxin Take 1 tablet (500 mg total) by mouth every 6 (six) hours as needed for muscle spasms.   oxyCODONE 5 MG immediate release tablet Commonly known as: Oxy IR/ROXICODONE Take 1-2 tablets (5-10 mg total) by mouth every 4 (four) hours as needed for moderate pain or severe pain.   polyethylene glycol 17 g packet Commonly known as: MIRALAX / GLYCOLAX Take 17 g by mouth 2 (two) times daily.            Discharge Care Instructions  (From admission, onward)         Start     Ordered   07/28/19 0000  Weight bearing as tolerated    Question Answer Comment  Laterality left   Extremity Lower      07/28/19 0841         Follow-up Information    Durene Romans, MD. Schedule an appointment as soon as possible for a visit in 2 weeks.   Specialty: Orthopedic Surgery Contact information: [address] [place] [address] [phone]        primary care physician. Schedule an appointment as soon as possible for a visit in 2 week(s).        [facility]. Go to.   Why: January 6th, at 9:30am. Please arrive 15 minutes early.  Contact information: [address] [place] [address] [phone]         Allergies  Allergen Reactions  . Methocarbamol Swelling  . Mobic [Meloxicam] Swelling  . Darvon [Propoxyphene] Hives, Itching and Rash    Consultations:  Orthopedics.     Procedures/Studies: DG Pelvis Portable  Result Date: 07/26/2019 CLINICAL DATA:  Status post placement of a new femoral head and acetabular liner. EXAM: PORTABLE PELVIS 1-2 VIEWS COMPARISON:  07/24/2011 FINDINGS: New larger femoral head prosthesis. No dislocation. No hip or pelvic fractures. IMPRESSION: New larger femoral head prosthesis. No dislocation or fractures. Electronically Signed   By: Rudie Meyer M.D.   On: 07/26/2019 12:38   DG Chest Port 1 View  Result Date: 07/24/2019 CLINICAL DATA:  History of hip dislocation.  Preoperative exam. EXAM: PORTABLE CHEST 1 VIEW COMPARISON:  Single-view of the chest 12/13/2008. FINDINGS: Lungs clear. Heart size normal. Atherosclerosis. No pneumothorax or pleural effusion. No acute bony abnormality. Remote lower right rib fracture noted. IMPRESSION: No acute disease. Electronically Signed   By: Drusilla Kanner M.D.  On: 07/24/2019 13:51   DG Hip Unilat W or Wo Pelvis 2-3 Views Left  Result Date: 07/24/2019 CLINICAL DATA:  Left hip pain. History of dislocation. No recent injury. EXAM: DG HIP (WITH OR WITHOUT PELVIS) 2-3V LEFT COMPARISON:  Plain films left hip 07/11/2019. FINDINGS: The patient has a left hip arthroplasty. The device is posteriorly, superiorly dislocated. Postoperative change of fixation of left acetabular fractures noted. No acute fracture. IMPRESSION: Dislocated left hip prosthesis Electronically Signed   By: Drusilla Kanner M.D.   On: 07/24/2019 13:53   DG Hip Unilat With Pelvis 2-3 Views Left  Result Date: 07/11/2019 CLINICAL DATA:  Left hip pain. EXAM: DG HIP (WITH OR WITHOUT PELVIS) 2-3V LEFT COMPARISON:  10/01/2014 FINDINGS: Bones are demineralized. SI joints and symphysis pubis unremarkable. Patient is status post left total hip replacement with plate and screw fixation of the posterior column acetabulum. The femoral component of the total hip replacement is superiorly dislocated. IMPRESSION: Superior dislocation of the femoral  component in this patient status post left total hip replacement. Electronically Signed   By: Kennith Center M.D.   On: 07/11/2019 14:22       Subjective: Pain controlled. Discussed the plan with the patient and family member.   Discharge Exam: Vitals:   07/28/19 0957 07/28/19 1431  BP: 111/70 121/72  Pulse: 70 68  Resp: 15 15  Temp: 98 F (36.7 C) 98.7 F (37.1 C)  SpO2: 100% 100%   Vitals:   07/27/19 2228 07/28/19 0524 07/28/19 0957 07/28/19 1431  BP: 106/69 114/75 111/70 121/72  Pulse: 65 70 70 68  Resp: 17 17 15 15   Temp: 98 F (36.7 C) 98.4 F (36.9 C) 98 F (36.7 C) 98.7 F (37.1 C)  TempSrc: Oral Oral Oral Oral  SpO2: 97% 98% 100% 100%  Weight:      Height:        General: Pt is alert, awake, not in acute distress Cardiovascular: RRR, S1/S2 +, no rubs, no gallops Respiratory: CTA bilaterally, no wheezing, no rhonchi Abdominal: Soft, NT, ND, bowel sounds + Extremities: no edema, no cyanosis    The results of significant diagnostics from this hospitalization (including imaging, microbiology, ancillary and laboratory) are listed below for reference.     Microbiology: Recent Results (from the past 240 hour(s))  SARS CORONAVIRUS 2 (TAT 6-24 HRS) Nasopharyngeal Nasopharyngeal Swab     Status: None   Collection Time: 07/24/19  4:19 PM   Specimen: Nasopharyngeal Swab  Result Value Ref Range Status   SARS Coronavirus 2 NEGATIVE NEGATIVE Final    Comment: (NOTE) SARS-CoV-2 target nucleic acids are NOT DETECTED. The SARS-CoV-2 RNA is generally detectable in upper and lower respiratory specimens during the acute phase of infection. Negative results do not preclude SARS-CoV-2 infection, do not rule out co-infections with other pathogens, and should not be used as the sole basis for treatment or other patient management decisions. Negative results must be combined with clinical observations, patient history, and epidemiological information. The expected result  is Negative. Fact Sheet for Patients: HairSlick.no Fact Sheet for Healthcare Providers: quierodirigir.com This test is not yet approved or cleared by the [place] FDA and  has been authorized for detection and/or diagnosis of SARS-CoV-2 by FDA under an Emergency Use Authorization (EUA). This EUA will remain  in effect (meaning this test can be used) for the duration of the COVID-19 declaration under Section 56 4(b)(1) of the Act, 21 U.S.C. section 360bbb-3(b)(1), unless the authorization is terminated or revoked sooner.  Performed at [facility], [address], [place], [place] [address]   Surgical PCR screen     Status: Abnormal   Collection Time: 07/25/19  6:02 AM   Specimen: Nasal Mucosa; Nasal Swab  Result Value Ref Range Status   MRSA, PCR NEGATIVE NEGATIVE Final   Staphylococcus aureus POSITIVE (A) NEGATIVE Final    Comment: (NOTE) The Xpert SA Assay (FDA approved for NASAL specimens in patients 48 years of age and older), is one component of a comprehensive surveillance program. It is not intended to diagnose infection nor to guide or monitor treatment. Performed at [facility], [address] [place], [place] [address]      Labs: BNP (last 3 results) No results for input(s): BNP in the last 8760 hours. Basic Metabolic Panel: Recent Labs  Lab 07/24/19 1402 07/25/19 0239 07/26/19 0255 07/27/19 0405 07/28/19 0419  NA 140 141 141 138 141  K 3.3* 3.4* 4.2 4.1 4.0  CL 101 102 103 104 105  CO2 28 29 28 24 27   GLUCOSE 75 135* 108* 95 88  BUN 19 20 18 14 13   CREATININE 0.92 1.14* 1.03* 1.22* 0.94  CALCIUM 9.6 9.2 9.3 8.7* 9.2   Liver Function Tests: No results for input(s): AST, ALT, ALKPHOS, BILITOT, PROT, ALBUMIN in the last 168 hours. No results for input(s): LIPASE, AMYLASE in the last 168 hours. No results for input(s): AMMONIA in the last 168 hours. CBC: Recent Labs  Lab  07/24/19 1402 07/25/19 0239 07/25/19 2043 07/27/19 0405 07/28/19 0419  WBC 5.7 5.7 5.8 10.0 11.0*  HGB 10.6* 9.7* 10.4* 9.2* 9.4*  HCT 34.9* 31.3* 34.2* 29.2* 30.6*  MCV 92.3 91.3 92.7 90.1 93.0  PLT 325 339 374 343 354   Cardiac Enzymes: No results for input(s): CKTOTAL, CKMB, CKMBINDEX, TROPONINI in the last 168 hours. BNP: Invalid input(s): POCBNP CBG: No results for input(s): GLUCAP in the last 168 hours. D-Dimer No results for input(s): DDIMER in the last 72 hours. Hgb A1c No results for input(s): HGBA1C in the last 72 hours. Lipid Profile No results for input(s): CHOL, HDL, LDLCALC, TRIG, CHOLHDL, LDLDIRECT in the last 72 hours. Thyroid function studies No results for input(s): TSH, T4TOTAL, T3FREE, THYROIDAB in the last 72 hours.  Invalid input(s): FREET3 Anemia work up No results for input(s): VITAMINB12, FOLATE, FERRITIN, TIBC, IRON, RETICCTPCT in the last 72 hours. Urinalysis    Component Value Date/Time   COLORURINE AMBER (A) 02/03/2018 2142   APPEARANCEUR HAZY (A) 02/03/2018 2142   LABSPEC 1.023 02/03/2018 2142   PHURINE 5.0 02/03/2018 2142   GLUCOSEU NEGATIVE 02/03/2018 2142   HGBUR SMALL (A) 02/03/2018 2142   BILIRUBINUR NEGATIVE 02/03/2018 2142   KETONESUR NEGATIVE 02/03/2018 2142   PROTEINUR 30 (A) 02/03/2018 2142   UROBILINOGEN 1.0 04/10/2010 0835   NITRITE POSITIVE (A) 02/03/2018 2142   LEUKOCYTESUR NEGATIVE 02/03/2018 2142   Sepsis Labs Invalid input(s): PROCALCITONIN,  WBC,  LACTICIDVEN Microbiology Recent Results (from the past 240 hour(s))  SARS CORONAVIRUS 2 (TAT 6-24 HRS) Nasopharyngeal Nasopharyngeal Swab     Status: None   Collection Time: 07/24/19  4:19 PM   Specimen: Nasopharyngeal Swab  Result Value Ref Range Status   SARS Coronavirus 2 NEGATIVE NEGATIVE Final    Comment: (NOTE) SARS-CoV-2 target nucleic acids are NOT DETECTED. The SARS-CoV-2 RNA is generally detectable in upper and lower respiratory specimens during the acute  phase of infection. Negative results do not preclude SARS-CoV-2 infection, do not rule out co-infections  with other pathogens, and should not be used as the sole basis for treatment or other patient management decisions. Negative results must be combined with clinical observations, patient history, and epidemiological information. The expected result is Negative. Fact Sheet for Patients: HairSlick.no Fact Sheet for Healthcare Providers: quierodirigir.com This test is not yet approved or cleared by the [place] FDA and  has been authorized for detection and/or diagnosis of SARS-CoV-2 by FDA under an Emergency Use Authorization (EUA). This EUA will remain  in effect (meaning this test can be used) for the duration of the COVID-19 declaration under Section 56 4(b)(1) of the Act, 21 U.S.C. section 360bbb-3(b)(1), unless the authorization is terminated or revoked sooner. Performed at [facility], [address] [place], [place] [address]   Surgical PCR screen     Status: Abnormal   Collection Time: 07/25/19  6:02 AM   Specimen: Nasal Mucosa; Nasal Swab  Result Value Ref Range Status   MRSA, PCR NEGATIVE NEGATIVE Final   Staphylococcus aureus POSITIVE (A) NEGATIVE Final    Comment: (NOTE) The Xpert SA Assay (FDA approved for NASAL specimens in patients 85 years of age and older), is one component of a comprehensive surveillance program. It is not intended to diagnose infection nor to guide or monitor treatment. Performed at [facility], [address] [place], [place] [address]      Time coordinating discharge: 32 minutes  SIGNED:   Kathlen Mody, MD  [facility]

## 2019-07-28 NOTE — Progress Notes (Signed)
     Subjective: 2 Days Post-Op Procedure(s) (LRB): POSTERIOR HEAD/BALL LINER EXCHANGE LEFT HIP (Left)   Patient reports pain as mild, resting comfortably in bed.  Pain controlled. No reported events throughout the night. Orthopaedically stable.       Objective:   VITALS:   Vitals:   07/28/19 0524 07/28/19 0957  BP: 114/75 111/70  Pulse: 70 70  Resp: 17 15  Temp: 98.4 F (36.9 C) 98 F (36.7 C)  SpO2: 98% 100%    Dorsiflexion/Plantar flexion intact Incision: dressing C/D/I No cellulitis present Compartment soft  LABS Recent Labs    07/25/19 2043 07/27/19 0405 07/28/19 0419  HGB 10.4* 9.2* 9.4*  HCT 34.2* 29.2* 30.6*  WBC 5.8 10.0 11.0*  PLT 374 343 354    Recent Labs    07/26/19 0255 07/27/19 0405 07/28/19 0419  NA 141 138 141  K 4.2 4.1 4.0  BUN 18 14 13   CREATININE 1.03* 1.22* 0.94  GLUCOSE 108* 95 88     Assessment/Plan: 2 Days Post-Op Procedure(s) (LRB): POSTERIOR HEAD/BALL LINER EXCHANGE LEFT HIP (Left) Up with therapy Orthopaedically stable Discharge home when ready medically  Ortho recommendations:  ASA 81 mg bid for 4 weeks for anticoagulation, unless other medically indicated.  Oxycodone for pain management (Rx written).  MiraLax and Colace for constipation  Iron 325 mg tid for 2-3 weeks   WBAT on the left leg.  Posterior hip precautions  Dressing to remain in place until follow in clinic in 2 weeks.  Dressing is waterproof and may shower with it in place.  Follow up in 2 weeks at [facility]. Follow up with OLIN,Jalani Rominger D in 2 weeks.  Contact information:  [facility] [address], [address] [place]   PAC  07/28/2019, 11:04 AM

## 2019-07-28 NOTE — Progress Notes (Signed)
Physical Therapy Treatment Patient Details Name: [name] MRN: [id number] DOB: 1961/02/12 Today's Date: 07/28/2019    History of Present Illness Pt is 58 yo female who presented to the ED with L hip dislocation (has been ongoing issue for a couple months) and is now s/p revision of L THA on 07/26/19 with posterior precautions and WBAT.  PMH includes L THA 04/10/10, anemia, COPD, depression, hypothyroidism, and bil ORIF of patellas.    PT Comments    POD #2 pm session. Pt was much better at listening this afternoon. Pt's cousin was present for treatment. General bed mobility comments: Pt laying on right side with legs crossed. Pt instructed again on hip precautions and how important they are to follow. Pt used bed rails and required VC's not to bend past 90 degrees and not to IR L LE. General transfer comment: Pt required VC's for safety and to push up from the bed not to pull up on the walker. General Gait Details: Pt walked 85 ft to the therapy gym with a RW. Pt required VC's for sequencing and distance to the walker. General stair comments: Pt was able to go up and down stairs with cousin present. Pt cousin was educated on how to safely guard patient on the stairs. Pt and cousin taught "up with the good, down with the bad" Pt and cousin shown THA HEP and THP. Both expressed understanding of all exercises and precautions. All questions were asked and answered.   Follow Up Recommendations  Home health PT;Supervision/Assistance - 24 hour     Equipment Recommendations  Rolling walker with 5" wheels    Recommendations for Other Services       Precautions / Restrictions Precautions Precautions: Posterior Hip;Fall Precaution Booklet Issued: Yes (comment) Restrictions Weight Bearing Restrictions: No LLE Weight Bearing: Weight bearing as tolerated    Mobility  Bed Mobility Overal bed mobility: Modified Independent Bed Mobility: Supine to Sit     Supine to sit: Min guard;HOB  elevated     General bed mobility comments: Pt laying on right side with legs crossed. Pt instructed again on hip precautions and how important they are to follow. Pt used bed rails and required VC's not to bend past 90 degrees and not to IR L LE.  Transfers Overall transfer level: Modified independent Equipment used: Rolling walker (2 wheeled) Transfers: Sit to/from Stand Sit to Stand: Min guard         General transfer comment: Pt required VC's for safety and to push up from the bed not to pull up on the walker.  Ambulation/Gait Ambulation/Gait assistance: Min guard Gait Distance (Feet): 160 Feet Assistive device: Rolling walker (2 wheeled) Gait Pattern/deviations: Decreased weight shift to left;Step-through pattern Gait velocity: increased   General Gait Details: Pt walked 85 ft to the therapy gym with a RW. Pt required VC's for sequencing and distance to the walker.   Stairs Stairs: Yes Stairs assistance: Min guard Stair Management: Two rails;Step to pattern;Forwards Number of Stairs: 2 General stair comments: Pt was able to go up and down stairs with cousin present. Pt cousin was educated on how to safely guard patient on the stairs. Pt and cousin taught "up with the good, down with the bad"   Wheelchair Mobility    Modified Rankin (Stroke Patients Only)       Balance  Cognition Arousal/Alertness: Awake/alert Behavior During Therapy: WFL for tasks assessed/performed Overall Cognitive Status: Within Functional Limits for tasks assessed                                 General Comments: Pt required 100% verbal and tactile cueing and had zero retention of precautions or instruction      Exercises Total Joint Exercises Ankle Circles/Pumps: AROM;Both;5 reps;Supine Quad Sets: AROM;Seated;Both;5 reps Gluteal Sets: AROM;Both;5 reps;Supine Heel Slides: AROM;Left;5 reps;Supine Hip  ABduction/ADduction: AROM;Left;5 reps;Supine;Standing Long [employer]: AROM;Seated;Left;5 reps Knee Flexion: AROM;Standing;Left;5 reps Marching in Standing: AROM;Left;Standing;5 reps Standing Hip Extension: AROM;Left;Standing;5 reps    General Comments        Pertinent Vitals/Pain Pain Assessment: Faces Pain Score: 10-Worst pain ever Faces Pain Scale: Hurts a little bit Pain Location: L hip Pain Descriptors / Indicators: Aching;Discomfort Pain Intervention(s): Monitored during session;Repositioned;Ice applied    Home Living                      Prior Function            PT Goals (current goals can now be found in the care plan section) Progress towards PT goals: Progressing toward goals    Frequency    7X/week      PT Plan Current plan remains appropriate    Co-evaluation              AM-PAC PT "6 Clicks" Mobility   Outcome Measure  Help needed turning from your back to your side while in a flat bed without using bedrails?: None Help needed moving from lying on your back to sitting on the side of a flat bed without using bedrails?: A Little Help needed moving to and from a bed to a chair (including a wheelchair)?: A Little Help needed standing up from a chair using your arms (e.g., wheelchair or bedside chair)?: None Help needed to walk in hospital room?: None Help needed climbing 3-5 steps with a railing? : A Little 6 Click Score: 21    End of Session Equipment Utilized During Treatment: Gait belt Activity Tolerance: Patient tolerated treatment well Patient left: with call bell/phone within reach;in bed;with bed alarm set;with family/visitor present Nurse Communication: Mobility status PT Visit Diagnosis: Other abnormalities of gait and mobility (R26.89);Muscle weakness (generalized) (M62.81)     Time: 1435-1500 PT Time Calculation (min) (ACUTE ONLY): 25 min  Charges:  $Gait Training: 8-22 mins $Therapeutic Exercise: 8-22 mins $Therapeutic  Activity: 8-22 mins                     Excell Seltzer, [facility]

## 2019-07-28 NOTE — Progress Notes (Signed)
Physical Therapy Treatment Patient Details Name: [name] MRN: [id number] DOB: 01/14/1961 Today's Date: 07/28/2019    History of Present Illness Pt is 58 yo female who presented to the ED with L hip dislocation (has been ongoing issue for a couple months) and is now s/p revision of L THA on 07/26/19 with posterior precautions and WBAT.  PMH includes L THA 04/10/10, anemia, COPD, depression, hypothyroidism, and bil ORIF of patellas.    PT Comments    POD #2 pm session. Pt was very impulsive and required 100% cueing with 0% retention. General bed mobility comments: Pt laying on right side with pillow between knees. Pt used bed rails and required 100% VC's not to bend past 90 degrees and not to IR L LE. General transfer comment: Pt required VC's for safety and to push up from the bed not to pull up on the walker. General Gait Details: Pt walked 85 ft to the therapy gym with a RW. Pt required 100 % VC's for sequencing, distance to the walker, and to slow down. General stair comments: Pt was able to attempt stairs but would not follow directions on how to safely ascend and desend the stairs. "use the walker" "up with the good, down with the bad" Pt required 100% verbal and tactile cueing ambulate stairs. Written THP were given as well as THA HEP. Pt was asked to review them before her afternoon session.   Follow Up Recommendations  Home health PT;Supervision/Assistance - 24 hour     Equipment Recommendations  Rolling walker with 5" wheels    Recommendations for Other Services       Precautions / Restrictions Precautions Precautions: Posterior Hip;Fall Precaution Booklet Issued: Yes (comment) Restrictions Weight Bearing Restrictions: No LLE Weight Bearing: Weight bearing as tolerated    Mobility  Bed Mobility Overal bed mobility: Needs Assistance Bed Mobility: Supine to Sit     Supine to sit: Min guard;HOB elevated     General bed mobility comments: Pt laying on right side with  pillow between knees. Pt used bed rails and required 100% VC's not to bend past 90 degrees and not to IR L LE.  Transfers Overall transfer level: Needs assistance Equipment used: Rolling walker (2 wheeled) Transfers: Sit to/from Stand Sit to Stand: Min guard         General transfer comment: Pt required VC's for safety and to push up from the bed not to pull up on the walker.  Ambulation/Gait Ambulation/Gait assistance: Min guard Gait Distance (Feet): 85 Feet Assistive device: Rolling walker (2 wheeled) Gait Pattern/deviations: Decreased weight shift to left;Step-through pattern Gait velocity: increased   General Gait Details: Pt walked 85 ft to the therapy gym with a RW. Pt required 100 % VC's for sequencing, distance to the walker, and to slow down.   Stairs Stairs: Yes Stairs assistance: Min assist Stair Management: No rails;With walker Number of Stairs: 2 General stair comments: Pt was able to attempt stairs but would not follow directions on how to safely ascend and desend the stairs. "use the walker" "up with the good, down with the bad" Pt required 100% verbal and tactile cueing ambulate stairs   Wheelchair Mobility    Modified Rankin (Stroke Patients Only)       Balance  Cognition Arousal/Alertness: Awake/alert Behavior During Therapy: Agitated;Impulsive                                   General Comments: Pt required 100% verbal and tactile cueing and had zero retention of precautions or instruction      Exercises Total Joint Exercises Ankle Circles/Pumps: AROM;Both;5 reps;Seated Quad Sets: AROM;Seated;Both;5 reps Gluteal Sets: AROM;Seated;Both;5 reps Heel Slides: AROM;Left;5 reps;Seated Hip ABduction/ADduction: AROM;Left;Seated;5 reps    General Comments        Pertinent Vitals/Pain Pain Assessment: 0-10 Pain Score: 10-Worst pain ever Pain Location: L hip (pt stated  10/10 but able to get up and walk quickly, moving without indication of pain) Pain Descriptors / Indicators: Discomfort Pain Intervention(s): Monitored during session;Repositioned    Home Living                      Prior Function            PT Goals (current goals can now be found in the care plan section) Progress towards PT goals: Progressing toward goals    Frequency    7X/week      PT Plan Current plan remains appropriate    Co-evaluation              AM-PAC PT "6 Clicks" Mobility   Outcome Measure  Help needed turning from your back to your side while in a flat bed without using bedrails?: None Help needed moving from lying on your back to sitting on the side of a flat bed without using bedrails?: A Little Help needed moving to and from a bed to a chair (including a wheelchair)?: A Little Help needed standing up from a chair using your arms (e.g., wheelchair or bedside chair)?: None Help needed to walk in hospital room?: None Help needed climbing 3-5 steps with a railing? : A Lot 6 Click Score: 20    End of Session Equipment Utilized During Treatment: Gait belt Activity Tolerance: Patient tolerated treatment well;Other (comment)(Pt limited by inability to listen and follow directions) Patient left: in chair;with call bell/phone within reach;with chair alarm set Nurse Communication: Mobility status PT Visit Diagnosis: Other abnormalities of gait and mobility (R26.89);Muscle weakness (generalized) (M62.81)     Time: 9622-2979 PT Time Calculation (min) (ACUTE ONLY): 25 min  Charges:  $Gait Training: 8-22 mins $Therapeutic Activity: 8-22 mins                     Excell Seltzer, [facility]

## 2019-07-28 NOTE — Progress Notes (Signed)
RN reviewed d/c paperwork with [name], and patient, at patient bedside after patient successfully completed the stairs.   Patient will be discharged today.     [place], Therapist, sports

## 2019-07-28 NOTE — TOC Initial Note (Addendum)
Transition of Care [facility]) - Initial/Assessment Note    Patient Details  Name: [name] MRN: [id number] Date of Birth: 1960-12-30  Transition of Care [facility]) CM/SW Contact:    [name], [place] Phone Number: 07/28/2019, 12:44 PM  Clinical Narrative:                 Patient admitted for hip pain and underwent surgical intervention. Patient evaluated by PT, recommendation for Home Health PT. Patient agreeable. Patient reports she will discharge to relative and caregiver home, [place]. Patient requested CSW share information with her relative. CSW reached out to her caregiver to discuss patient physical therapy plan. [name].She reports the patient lives in [place] with her adult children and grandchildren. The patient recently had to relocate to a motel. Caregiver reports the patient can stay with her as long as she needs to.  CSW gave the patient a list of Medicare.gov home health agencies. Patient chose [facility]  CSW reached out to the Rep. Santiago Glad to confirm staffing availability.   CSW ordered DME -RW and 3 IN 1.  Patient caregiver coordinating transportation.   Patient reports she does not have a Primary Care Physician and has not established one in the area.  [facility] at [facility]- January 6th @ 9:30am, patient and caregiver aware.    Expected Discharge Plan: [place] Barriers to Discharge: No Barriers Identified   Patient Goals and CMS Choice Patient states their goals for this hospitalization and ongoing recovery are:: "walk with no pain" CMS Medicare.gov Compare Post Acute Care list provided to:: Patient Choice offered to / list presented to : Patient  Expected Discharge Plan and Services Expected Discharge Plan: [place] In-house Referral: Clinical Social Work Discharge Planning Services: CM Consult Post Acute Care Choice: [place]  arrangements for the past 2 months: Hotel/Motel, Single Family Home Expected Discharge Date: 07/28/19               DME Arranged: 3-N-1, [place] Rile rolling DME Agency: [employer] Date DME Agency Contacted: 07/28/19 Time DME Agency Contacted: [id number] Representative spoke with at DME Agency: [place]: PT, OT [place] Agency: [place] ([place]) Date [place]: 07/28/19 Time Reading: 6644 Representative spoke with at [place]: Santiago Glad  Prior Living Arrangements/Services Living arrangements for the past 2 months: Hotel/Motel, Single Family Home Lives with:: Adult Children Patient language and need for interpreter reviewed:: Yes Do you feel safe going back to the place where you live?: Yes      Need for Family Participation in Patient Care: Yes (Comment) Care giver support system in place?: Yes (comment)   Criminal Activity/Legal Involvement Pertinent to Current Situation/Hospitalization: No - Comment as needed  Activities of Daily Living Home Assistive Devices/Equipment: Dentures (specify type), Walker (specify type)(upper denture, front wheeled walker) ADL Screening (condition at time of admission) Patient's cognitive ability adequate to safely complete daily activities?: Yes Is the patient deaf or have difficulty hearing?: Yes(patient hard of hearing) Does the patient have difficulty seeing, even when wearing glasses/contacts?: No Does the patient have difficulty concentrating, remembering, or making decisions?: No Patient able to express need for assistance with ADLs?: Yes Does the patient have difficulty dressing or bathing?: Yes Independently performs ADLs?: No Communication: Independent Dressing (OT): Needs assistance Is this a change from baseline?: Change from baseline, expected to last >3 days Grooming: Needs assistance Is this a change from  baseline?: Change from baseline, expected to last >3 days Feeding: Independent Is this a change from  baseline?: Pre-admission baseline Bathing: Needs assistance Is this a change from baseline?: Change from baseline, expected to last >3 days Toileting: Dependent Is this a change from baseline?: Change from baseline, expected to last >3days In/Out Bed: Dependent Is this a change from baseline?: Change from baseline, expected to last >3 days Walks in Home: Dependent Is this a change from baseline?: Change from baseline, expected to last >3 days Does the patient have difficulty walking or climbing stairs?: Yes(secondary to left hip pain) Weakness of Legs: Left Weakness of Arms/Hands: None  Permission Sought/Granted Permission sought to share information with : Case Manager Permission granted to share information with : Yes, Verbal Permission Granted  Share Information with NAME: dowda,Julie  Permission granted to share info w AGENCY: Home Health  Permission granted to share info w Relationship: Relative  Permission granted to share info w Contact Information: [phone]  Emotional Assessment Appearance:: Developmentally appropriate, Appears stated age Attitude/Demeanor/Rapport: Engaged Affect (typically observed): Accepting, Calm Orientation: : Oriented to Self, Oriented to Place, Oriented to  Time, Oriented to Situation Alcohol / Substance Use: Tobacco Use Psych Involvement: No (comment)  Admission diagnosis:  Central dislocation of left hip, sequela [S73.045S] Patient Active Problem List   Diagnosis Date Noted  . Dislocated hip, left, sequela 07/24/2019  . Hypokalemia 07/24/2019  . Anemia of chronic disease 07/24/2019  . Prolonged QT interval 02/04/2018  . Toxic metabolic encephalopathy 02/04/2018  . Perforated tympanic membrane 02/04/2018  . Acute otitis externa of right ear 02/03/2018  . Acute lower UTI 02/03/2018  . Polysubstance abuse (HCC) 02/03/2018   PCP:  Patient, No Pcp Per Pharmacy:   [id number] [place], [place] - [address]  [place] [address] Phone: [phone] Fax: [phone]  [facility] [place], [place] - [address] [place] [address] Phone: [phone] Fax: [phone]     Social Determinants of Health (SDOH) Interventions    Readmission Risk Interventions No flowsheet data found.

## 2019-07-28 NOTE — Progress Notes (Signed)
RN reviewed discharge instructions in detail with Renee Ramus, patient's cousin and caregiver.     Glenard Haring stated understanding of patients medication and hip precautions. As well as importance of keeping patient follow up appointments.    Glenard Haring is calling a family member to see if she can find a family member to come learn how to assist patient with stairs and getting in the house.    Will wait for call from [facility]  . . . . Sundra Aland, RN

## 2019-08-26 ENCOUNTER — Ambulatory Visit: Payer: Medicare Other | Admitting: Family Medicine

## 2019-09-15 ENCOUNTER — Ambulatory Visit: Payer: Medicare Other | Admitting: Family Medicine

## 2020-04-08 ENCOUNTER — Emergency Department (HOSPITAL_COMMUNITY)
Admission: EM | Admit: 2020-04-08 | Discharge: 2020-04-08 | Discharge status: Against Medical Advice | Payer: Medicare Other | Source: Home / Self Care | Attending: Emergency Medicine | Admitting: Emergency Medicine

## 2020-04-08 ENCOUNTER — Other Ambulatory Visit: Payer: Self-pay

## 2020-04-08 ENCOUNTER — Encounter (HOSPITAL_COMMUNITY): Payer: Self-pay

## 2020-04-08 ENCOUNTER — Encounter (HOSPITAL_COMMUNITY): Payer: Self-pay | Admitting: Pediatrics

## 2020-04-08 ENCOUNTER — Emergency Department (HOSPITAL_COMMUNITY)
Admission: EM | Admit: 2020-04-08 | Discharge: 2020-04-10 | Disposition: A | Discharge status: Home / Self Care | Payer: Medicare Other | Attending: Emergency Medicine | Admitting: Emergency Medicine

## 2020-04-08 DIAGNOSIS — F17210 Nicotine dependence, cigarettes, uncomplicated: Secondary | ICD-10-CM | POA: Insufficient documentation

## 2020-04-08 DIAGNOSIS — Z79890 Hormone replacement therapy (postmenopausal): Secondary | ICD-10-CM | POA: Insufficient documentation

## 2020-04-08 DIAGNOSIS — E039 Hypothyroidism, unspecified: Secondary | ICD-10-CM | POA: Diagnosis not present

## 2020-04-08 DIAGNOSIS — R45851 Suicidal ideations: Secondary | ICD-10-CM

## 2020-04-08 DIAGNOSIS — F1910 Other psychoactive substance abuse, uncomplicated: Secondary | ICD-10-CM | POA: Diagnosis not present

## 2020-04-08 DIAGNOSIS — T50901A Poisoning by unspecified drugs, medicaments and biological substances, accidental (unintentional), initial encounter: Secondary | ICD-10-CM

## 2020-04-08 DIAGNOSIS — J449 Chronic obstructive pulmonary disease, unspecified: Secondary | ICD-10-CM | POA: Insufficient documentation

## 2020-04-08 DIAGNOSIS — Z79899 Other long term (current) drug therapy: Secondary | ICD-10-CM | POA: Diagnosis not present

## 2020-04-08 DIAGNOSIS — Z96642 Presence of left artificial hip joint: Secondary | ICD-10-CM | POA: Insufficient documentation

## 2020-04-08 LAB — COMPREHENSIVE METABOLIC PANEL
ALT: 42 U/L (ref 0–44)
ALT: 45 U/L — ABNORMAL HIGH (ref 0–44)
AST: 47 U/L — ABNORMAL HIGH (ref 15–41)
AST: 41 U/L (ref 15–41)
Albumin: 3.7 g/dL (ref 3.5–5.0)
Albumin: 4.1 g/dL (ref 3.5–5.0)
Alkaline Phosphatase: 85 U/L (ref 38–126)
Alkaline Phosphatase: 90 U/L (ref 38–126)
Anion gap: 11 (ref 5–15)
Anion gap: 10 (ref 5–15)
BUN: 9 mg/dL (ref 6–20)
BUN: 10 mg/dL (ref 6–20)
CO2: 26 mmol/L (ref 22–32)
CO2: 30 mmol/L (ref 22–32)
Calcium: 9.1 mg/dL (ref 8.9–10.3)
Calcium: 10 mg/dL (ref 8.9–10.3)
Chloride: 103 mmol/L (ref 98–111)
Chloride: 103 mmol/L (ref 98–111)
Creatinine, Ser: 0.95 mg/dL (ref 0.44–1.00)
Creatinine, Ser: 1.14 mg/dL — ABNORMAL HIGH (ref 0.44–1.00)
GFR calc Af Amer: >60 mL/min (ref >60)
GFR calc Af Amer: >60 mL/min (ref >60)
GFR calc non Af Amer: >60 mL/min (ref >60)
GFR calc non Af Amer: 53 mL/min — ABNORMAL LOW (ref >60)
Glucose, Bld: 120 mg/dL — ABNORMAL HIGH (ref 70–99)
Glucose, Bld: 123 mg/dL — ABNORMAL HIGH (ref 70–99)
Potassium: 3.8 mmol/L (ref 3.5–5.1)
Potassium: 4.5 mmol/L (ref 3.5–5.1)
Sodium: 140 mmol/L (ref 135–145)
Sodium: 143 mmol/L (ref 135–145)
Total Bilirubin: 1 mg/dL (ref 0.3–1.2)
Total Bilirubin: 1 mg/dL (ref 0.3–1.2)
Total Protein: 7.5 g/dL (ref 6.5–8.1)
Total Protein: 7.9 g/dL (ref 6.5–8.1)

## 2020-04-08 LAB — CBC
HCT: 41.5 % (ref 36.0–46.0)
Hemoglobin: 12.5 g/dL (ref 12.0–15.0)
MCH: 25.9 pg — ABNORMAL LOW (ref 26.0–34.0)
MCHC: 30.1 g/dL (ref 30.0–36.0)
MCV: 86.1 fL (ref 80.0–100.0)
Platelets: 362 10*3/uL (ref 150–400)
RBC: 4.82 MIL/uL (ref 3.87–5.11)
RDW: 16.8 % — ABNORMAL HIGH (ref 11.5–15.5)
WBC: 8.2 10*3/uL (ref 4.0–10.5)
nRBC: 0 % (ref 0.0–0.2)

## 2020-04-08 LAB — CBC WITH DIFFERENTIAL/PLATELET
Abs Immature Granulocytes: 0.01 10*3/uL (ref 0.00–0.07)
Basophils Absolute: 0 10*3/uL (ref 0.0–0.1)
Basophils Relative: 1 %
Eosinophils Absolute: 0.4 10*3/uL (ref 0.0–0.5)
Eosinophils Relative: 8 %
HCT: 39.6 % (ref 36.0–46.0)
Hemoglobin: 12.3 g/dL (ref 12.0–15.0)
Immature Granulocytes: 0 %
Lymphocytes Relative: 32 %
Lymphs Abs: 1.6 10*3/uL (ref 0.7–4.0)
MCH: 26.9 pg (ref 26.0–34.0)
MCHC: 31.1 g/dL (ref 30.0–36.0)
MCV: 86.5 fL (ref 80.0–100.0)
Monocytes Absolute: 0.2 10*3/uL (ref 0.1–1.0)
Monocytes Relative: 5 %
Neutro Abs: 2.7 10*3/uL (ref 1.7–7.7)
Neutrophils Relative %: 54 %
Platelets: 210 10*3/uL (ref 150–400)
RBC: 4.58 MIL/uL (ref 3.87–5.11)
RDW: 16.8 % — ABNORMAL HIGH (ref 11.5–15.5)
WBC: 5 10*3/uL (ref 4.0–10.5)
nRBC: 0 % (ref 0.0–0.2)

## 2020-04-08 LAB — I-STAT BETA HCG BLOOD, ED (MC, WL, AP ONLY): I-stat hCG, quantitative: <5 m[IU]/mL (ref <5)

## 2020-04-08 LAB — SALICYLATE LEVEL
Salicylate Lvl: <7 mg/dL — ABNORMAL LOW (ref 7.0–30.0)
Salicylate Lvl: <7 mg/dL — ABNORMAL LOW (ref 7.0–30.0)

## 2020-04-08 LAB — RAPID URINE DRUG SCREEN, HOSP PERFORMED
Amphetamines: POSITIVE — AB
Barbiturates: NOT DETECTED
Benzodiazepines: POSITIVE — AB
Cocaine: NOT DETECTED
Opiates: NOT DETECTED
Tetrahydrocannabinol: NOT DETECTED

## 2020-04-08 LAB — ACETAMINOPHEN LEVEL
Acetaminophen (Tylenol), Serum: <10 ug/mL — ABNORMAL LOW (ref 10–30)
Acetaminophen (Tylenol), Serum: <10 ug/mL — ABNORMAL LOW (ref 10–30)

## 2020-04-08 LAB — ETHANOL
Alcohol, Ethyl (B): <10 mg/dL (ref <10)
Alcohol, Ethyl (B): <10 mg/dL (ref <10)

## 2020-04-08 MED ORDER — SODIUM CHLORIDE 0.9 % IV SOLN: INTRAVENOUS | Status: DC

## 2020-04-08 MED ORDER — SODIUM CHLORIDE 0.9 % IV BOLUS
1000.0000 mL | Freq: Once | INTRAVENOUS | Status: AC
  Administered 2020-04-08: 1000 mL via INTRAVENOUS

## 2020-04-08 NOTE — ED Notes (Signed)
Pt refusing treatment, requesting to leave. Provider made aware.

## 2020-04-08 NOTE — ED Triage Notes (Signed)
Patient arrived accompanied by PD; was seen earlier for heroine overdose. Pt is now endorsing hearing voices and wants to die.

## 2020-04-08 NOTE — ED Triage Notes (Signed)
Assume care from EMS, ems reports pt was found in car w/ son w/ possible over dose of unknown substance. EMS states upon arrival O2 sats were 67%  On room air and place pt on Tobias, pt o2 sats improve significantly. EMS reports given 1.5 IM narcan  And an additional 0.5 IV narcan, pt became fully alert w/ a CBG 141. Upon arrival pt is uncooperative and place on the monitor x 3, Vss at this time
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
## 2020-04-08 NOTE — ED Notes (Signed)
Pt found out of bed by staff, pt requesting to leave. Staff explained to pt leaving now would be AMA, pt insisted, attempted to call a ride for pt with no response. IV removed.

## 2020-04-09 DIAGNOSIS — T50901A Poisoning by unspecified drugs, medicaments and biological substances, accidental (unintentional), initial encounter: Secondary | ICD-10-CM | POA: Diagnosis not present

## 2020-04-09 NOTE — BHH Counselor (Signed)
Patient in the hallway and cannot been seen by TTS until a room is available

## 2020-04-09 NOTE — ED Notes (Signed)
Pt belongings placed in locker #5.  
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
## 2020-04-09 NOTE — ED Notes (Signed)
Pt brought to 52 for TTS
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
## 2020-04-09 NOTE — ED Notes (Signed)
Pt refusing to sign belonging inventory sheet until speaking to a provider. EDP made aware. This RN explained to pt that she needs evaluation by [facility] for further updates on IVC status. Pt upset, arguing with this RN that she does not need to be IVC.

## 2020-04-09 NOTE — ED Notes (Addendum)
Pt refusing COVID swab at this time, states she had one done a few days ago. Pt under 24 hour observation, plan for reassessment in am. BH made aware.

## 2020-04-09 NOTE — ED Notes (Signed)
BH contacted to try for TTS again.

## 2020-04-10 DIAGNOSIS — F1990 Other psychoactive substance use, unspecified, uncomplicated: Secondary | ICD-10-CM | POA: Diagnosis not present
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
## 2020-04-10 NOTE — ED Notes (Signed)
Pt's belongings returned to pt, including money and phone.  Patient verbalizes understanding of discharge instructions. Opportunity for questioning and answers were provided. Armband removed by staff, pt discharged from ED.

## 2020-04-10 NOTE — ED Provider Notes (Addendum)
0945: Patient Emergency Medicine Observation Re-evaluation Note  Julie [name] is a 59 y.o. female, seen on rounds today.  Pt initially presented to the ED for complaints of Suicidal Currently, the patient is asleep.   Physical Exam  BP (!) 140/91 (BP Location: Right Arm)   Pulse (!) 54   Temp 98.3 F (36.8 C) (Oral)   Resp 16 Comment: 18  SpO2 100%   Asleep in hall bed, sitter at bedside denies significant events this morning. Patient wakes up asking when she can leave.   ED Course / MDM  EKG:    I have reviewed the labs performed to date as well as medications administered while in observation.  Recent changes in the last 24 hours include none.  Plan   0945: patient has been observed in ED over night. Received phone call from Julie [name] from TTS/psych, deems patient appropriate for discharge from ED. Psych cleared. IVC to be rescinded by EDP.  Patient reportedly refused substance abuse and BH resources.  She denies drug use but UDS shows benzodiazepines and amphetamine.  ER work-up personally reviewed and interpreted and no significant or life-threatening laboratory abnormalities.  She refused Covid swab.  Vital signs have remained stable.  Patient has been asleep in the hall bed in no distress.  Will discharge at this time.  IVC rescinded by EDP [name].     Julie Handy, PA-C 04/10/20 1005    Julie Baton, MD 04/10/20 1316

## 2021-01-06 ENCOUNTER — Other Ambulatory Visit: Payer: Self-pay | Admitting: Orthopedic Surgery

## 2021-01-06 DIAGNOSIS — M67431 Ganglion, right wrist: Secondary | ICD-10-CM

## 2021-01-20 ENCOUNTER — Ambulatory Visit
Admission: RE | Admit: 2021-01-20 | Discharge: 2021-01-20 | Disposition: A | Discharge status: Home / Self Care | Payer: Medicare HMO | Source: Ambulatory Visit | Attending: Orthopedic Surgery | Admitting: Orthopedic Surgery

## 2021-01-20 DIAGNOSIS — M67431 Ganglion, right wrist: Secondary | ICD-10-CM

## 2021-04-07 ENCOUNTER — Other Ambulatory Visit: Payer: Self-pay | Admitting: Orthopedic Surgery

## 2021-04-07 DIAGNOSIS — M67431 Ganglion, right wrist: Secondary | ICD-10-CM

## 2021-05-01 ENCOUNTER — Other Ambulatory Visit: Payer: Medicare HMO

## 2021-05-01 ENCOUNTER — Inpatient Hospital Stay: Admission: RE | Admit: 2021-05-01 | Payer: Medicare HMO | Source: Ambulatory Visit

## 2021-05-22 ENCOUNTER — Ambulatory Visit
Admission: RE | Admit: 2021-05-22 | Discharge: 2021-05-22 | Disposition: A | Discharge status: Home / Self Care | Payer: Medicare HMO | Source: Ambulatory Visit | Attending: Orthopedic Surgery | Admitting: Orthopedic Surgery

## 2021-05-22 ENCOUNTER — Other Ambulatory Visit: Payer: Self-pay

## 2021-05-22 DIAGNOSIS — M67431 Ganglion, right wrist: Secondary | ICD-10-CM

## 2021-05-22 MED ORDER — IOPAMIDOL (ISOVUE-M 200) INJECTION 41%: 2.0000 mL | Freq: Once | INTRAMUSCULAR | Status: DC

## 2021-05-22 MED ORDER — GADOBUTROL 1 MMOL/ML IV SOLN
2.0000 mL | Freq: Once | INTRAVENOUS | Status: AC | PRN
  Administered 2021-05-22: 2 mL via INTRAVENOUS

## 2021-05-25 ENCOUNTER — Other Ambulatory Visit: Payer: Self-pay | Admitting: Orthopedic Surgery

## 2021-05-30 ENCOUNTER — Other Ambulatory Visit: Payer: Self-pay

## 2021-05-30 ENCOUNTER — Encounter (HOSPITAL_BASED_OUTPATIENT_CLINIC_OR_DEPARTMENT_OTHER): Payer: Self-pay | Admitting: Orthopedic Surgery

## 2021-06-05 ENCOUNTER — Encounter (HOSPITAL_BASED_OUTPATIENT_CLINIC_OR_DEPARTMENT_OTHER)
Admission: RE | Admit: 2021-06-05 | Discharge: 2021-06-05 | Disposition: A | Discharge status: Home / Self Care | Payer: Medicare HMO | Source: Ambulatory Visit | Attending: Orthopedic Surgery | Admitting: Orthopedic Surgery

## 2021-06-05 DIAGNOSIS — E039 Hypothyroidism, unspecified: Secondary | ICD-10-CM | POA: Diagnosis not present

## 2021-06-05 DIAGNOSIS — M67431 Ganglion, right wrist: Secondary | ICD-10-CM | POA: Diagnosis not present

## 2021-06-05 DIAGNOSIS — F17210 Nicotine dependence, cigarettes, uncomplicated: Secondary | ICD-10-CM | POA: Diagnosis not present

## 2021-06-05 DIAGNOSIS — Z01818 Encounter for other preprocedural examination: Secondary | ICD-10-CM | POA: Diagnosis present

## 2021-06-05 DIAGNOSIS — Z79890 Hormone replacement therapy (postmenopausal): Secondary | ICD-10-CM | POA: Diagnosis not present

## 2021-06-05 DIAGNOSIS — J449 Chronic obstructive pulmonary disease, unspecified: Secondary | ICD-10-CM | POA: Diagnosis not present

## 2021-06-05 DIAGNOSIS — R2231 Localized swelling, mass and lump, right upper limb: Secondary | ICD-10-CM | POA: Diagnosis present

## 2021-06-05 DIAGNOSIS — Z888 Allergy status to other drugs, medicaments and biological substances status: Secondary | ICD-10-CM | POA: Diagnosis not present

## 2021-06-05 LAB — COMPREHENSIVE METABOLIC PANEL
ALT: 60 U/L — ABNORMAL HIGH (ref 0–44)
AST: 43 U/L — ABNORMAL HIGH (ref 15–41)
Albumin: 3.5 g/dL (ref 3.5–5.0)
Alkaline Phosphatase: 154 U/L — ABNORMAL HIGH (ref 38–126)
Anion gap: 9 (ref 5–15)
BUN: 11 mg/dL (ref 6–20)
CO2: 27 mmol/L (ref 22–32)
Calcium: 9.7 mg/dL (ref 8.9–10.3)
Chloride: 102 mmol/L (ref 98–111)
Creatinine, Ser: 0.83 mg/dL (ref 0.44–1.00)
GFR, Estimated: >60 mL/min (ref >60)
Glucose, Bld: 103 mg/dL — ABNORMAL HIGH (ref 70–99)
Potassium: 4.8 mmol/L (ref 3.5–5.1)
Sodium: 138 mmol/L (ref 135–145)
Total Bilirubin: 0.6 mg/dL (ref 0.3–1.2)
Total Protein: 7.2 g/dL (ref 6.5–8.1)

## 2021-06-05 NOTE — Progress Notes (Signed)
Sent text reminding pt to come in for lab work today.  °

## 2021-06-05 NOTE — Progress Notes (Signed)

## 2021-06-06 ENCOUNTER — Encounter (HOSPITAL_BASED_OUTPATIENT_CLINIC_OR_DEPARTMENT_OTHER): Payer: Self-pay | Admitting: Orthopedic Surgery

## 2021-06-06 ENCOUNTER — Ambulatory Visit (HOSPITAL_BASED_OUTPATIENT_CLINIC_OR_DEPARTMENT_OTHER): Payer: Medicare HMO | Admitting: Anesthesiology

## 2021-06-06 ENCOUNTER — Ambulatory Visit (HOSPITAL_BASED_OUTPATIENT_CLINIC_OR_DEPARTMENT_OTHER)
Admission: RE | Admit: 2021-06-06 | Discharge: 2021-06-06 | Disposition: A | Discharge status: Home / Self Care | Payer: Medicare HMO | Attending: Orthopedic Surgery | Admitting: Orthopedic Surgery

## 2021-06-06 ENCOUNTER — Other Ambulatory Visit: Payer: Self-pay

## 2021-06-06 ENCOUNTER — Encounter (HOSPITAL_BASED_OUTPATIENT_CLINIC_OR_DEPARTMENT_OTHER)
Admission: RE | Disposition: A | Discharge status: Home / Self Care | Payer: Self-pay | Source: Home / Self Care | Attending: Orthopedic Surgery

## 2021-06-06 DIAGNOSIS — M67431 Ganglion, right wrist: Secondary | ICD-10-CM | POA: Diagnosis not present

## 2021-06-06 DIAGNOSIS — Z79890 Hormone replacement therapy (postmenopausal): Secondary | ICD-10-CM | POA: Insufficient documentation

## 2021-06-06 DIAGNOSIS — R2231 Localized swelling, mass and lump, right upper limb: Secondary | ICD-10-CM | POA: Insufficient documentation

## 2021-06-06 DIAGNOSIS — F17210 Nicotine dependence, cigarettes, uncomplicated: Secondary | ICD-10-CM | POA: Insufficient documentation

## 2021-06-06 DIAGNOSIS — E039 Hypothyroidism, unspecified: Secondary | ICD-10-CM | POA: Diagnosis not present

## 2021-06-06 DIAGNOSIS — Z888 Allergy status to other drugs, medicaments and biological substances status: Secondary | ICD-10-CM | POA: Insufficient documentation

## 2021-06-06 DIAGNOSIS — J449 Chronic obstructive pulmonary disease, unspecified: Secondary | ICD-10-CM | POA: Insufficient documentation

## 2021-06-06 HISTORY — PX: MASS EXCISION: SHX2000

## 2021-06-06 SURGERY — EXCISION MASS
Anesthesia: General | Site: Hand | Laterality: Right

## 2021-06-06 MED ORDER — CEFAZOLIN SODIUM-DEXTROSE 2-4 GM/100ML-% IV SOLN
2.0000 g | INTRAVENOUS | Status: AC
  Administered 2021-06-06: 2 g via INTRAVENOUS

## 2021-06-06 MED ORDER — MEPERIDINE HCL 25 MG/ML IJ SOLN: 6.2500 mg | INTRAMUSCULAR | Status: DC | PRN

## 2021-06-06 MED ORDER — HYDROMORPHONE HCL 1 MG/ML IJ SOLN
INTRAMUSCULAR | Status: AC
  Filled 2021-06-06: qty 0.5

## 2021-06-06 MED ORDER — 0.9 % SODIUM CHLORIDE (POUR BTL) OPTIME
TOPICAL | Status: DC | PRN
  Administered 2021-06-06: 100 mL

## 2021-06-06 MED ORDER — DEXAMETHASONE SODIUM PHOSPHATE 10 MG/ML IJ SOLN
INTRAMUSCULAR | Status: AC
  Filled 2021-06-06: qty 1

## 2021-06-06 MED ORDER — OXYCODONE HCL 5 MG PO TABS
ORAL_TABLET | ORAL | Status: AC
  Filled 2021-06-06: qty 1

## 2021-06-06 MED ORDER — AMISULPRIDE (ANTIEMETIC) 5 MG/2ML IV SOLN: 10.0000 mg | Freq: Once | INTRAVENOUS | Status: DC | PRN

## 2021-06-06 MED ORDER — BUPIVACAINE HCL (PF) 0.25 % IJ SOLN
INTRAMUSCULAR | Status: DC | PRN
  Administered 2021-06-06: 10 mL

## 2021-06-06 MED ORDER — HYDROCODONE-ACETAMINOPHEN 5-325 MG PO TABS: ORAL_TABLET | ORAL | 0 refills | Status: AC

## 2021-06-06 MED ORDER — CEFAZOLIN SODIUM-DEXTROSE 2-4 GM/100ML-% IV SOLN
INTRAVENOUS | Status: AC
  Filled 2021-06-06: qty 100

## 2021-06-06 MED ORDER — PROPOFOL 10 MG/ML IV BOLUS
INTRAVENOUS | Status: DC | PRN
  Administered 2021-06-06: 150 mg via INTRAVENOUS

## 2021-06-06 MED ORDER — ONDANSETRON HCL 4 MG/2ML IJ SOLN
INTRAMUSCULAR | Status: DC | PRN
  Administered 2021-06-06: 4 mg via INTRAVENOUS

## 2021-06-06 MED ORDER — ONDANSETRON HCL 4 MG/2ML IJ SOLN
INTRAMUSCULAR | Status: AC
  Filled 2021-06-06: qty 2

## 2021-06-06 MED ORDER — FENTANYL CITRATE (PF) 100 MCG/2ML IJ SOLN
INTRAMUSCULAR | Status: DC | PRN
  Administered 2021-06-06 (×4): 25 ug via INTRAVENOUS

## 2021-06-06 MED ORDER — LIDOCAINE 2% (20 MG/ML) 5 ML SYRINGE
INTRAMUSCULAR | Status: DC | PRN
  Administered 2021-06-06: 60 mg via INTRAVENOUS

## 2021-06-06 MED ORDER — LACTATED RINGERS IV SOLN: INTRAVENOUS | Status: DC

## 2021-06-06 MED ORDER — MIDAZOLAM HCL 5 MG/5ML IJ SOLN
INTRAMUSCULAR | Status: DC | PRN
  Administered 2021-06-06: 2 mg via INTRAVENOUS

## 2021-06-06 MED ORDER — PROPOFOL 10 MG/ML IV BOLUS
INTRAVENOUS | Status: AC
  Filled 2021-06-06: qty 20

## 2021-06-06 MED ORDER — OXYCODONE HCL 5 MG PO TABS
5.0000 mg | ORAL_TABLET | Freq: Once | ORAL | Status: AC | PRN
  Administered 2021-06-06: 5 mg via ORAL

## 2021-06-06 MED ORDER — LIDOCAINE 2% (20 MG/ML) 5 ML SYRINGE
INTRAMUSCULAR | Status: AC
  Filled 2021-06-06: qty 5

## 2021-06-06 MED ORDER — MIDAZOLAM HCL 2 MG/2ML IJ SOLN
INTRAMUSCULAR | Status: AC
  Filled 2021-06-06: qty 2

## 2021-06-06 MED ORDER — HYDROMORPHONE HCL 1 MG/ML IJ SOLN
0.2500 mg | INTRAMUSCULAR | Status: DC | PRN
  Administered 2021-06-06 (×4): 0.5 mg via INTRAVENOUS

## 2021-06-06 MED ORDER — FENTANYL CITRATE (PF) 100 MCG/2ML IJ SOLN
INTRAMUSCULAR | Status: AC
  Filled 2021-06-06: qty 2

## 2021-06-06 MED ORDER — OXYCODONE HCL 5 MG/5ML PO SOLN: 5.0000 mg | Freq: Once | ORAL | Status: AC | PRN

## 2021-06-06 MED ORDER — DEXAMETHASONE SODIUM PHOSPHATE 10 MG/ML IJ SOLN
INTRAMUSCULAR | Status: DC | PRN
  Administered 2021-06-06: 10 mg via INTRAVENOUS

## 2021-06-06 SURGICAL SUPPLY — 54 items
APL PRP STRL LF DISP 70% ISPRP (MISCELLANEOUS) ×1
APL SKNCLS STERI-STRIP NONHPOA (GAUZE/BANDAGES/DRESSINGS)
BENZOIN TINCTURE PRP APPL 2/3 (GAUZE/BANDAGES/DRESSINGS) IMPLANT
BLADE MINI RND TIP GREEN BEAV (BLADE) IMPLANT
BLADE SURG 15 STRL LF DISP TIS (BLADE) ×2 IMPLANT
BLADE SURG 15 STRL SS (BLADE) ×4
BNDG CMPR 9X4 STRL LF SNTH (GAUZE/BANDAGES/DRESSINGS) ×1
BNDG COHESIVE 1X5 TAN STRL LF (GAUZE/BANDAGES/DRESSINGS) ×1 IMPLANT
BNDG COHESIVE 2X5 TAN ST LF (GAUZE/BANDAGES/DRESSINGS) ×1 IMPLANT
BNDG CONFORM 2 STRL LF (GAUZE/BANDAGES/DRESSINGS) ×1 IMPLANT
BNDG ELASTIC 2X5.8 VLCR STR LF (GAUZE/BANDAGES/DRESSINGS) ×1 IMPLANT
BNDG ELASTIC 3X5.8 VLCR STR LF (GAUZE/BANDAGES/DRESSINGS) IMPLANT
BNDG ESMARK 4X9 LF (GAUZE/BANDAGES/DRESSINGS) ×1 IMPLANT
BNDG GAUZE 1X2.1 STRL (MISCELLANEOUS) IMPLANT
BNDG GAUZE ELAST 4 BULKY (GAUZE/BANDAGES/DRESSINGS) ×1 IMPLANT
BNDG PLASTER X FAST 3X3 WHT LF (CAST SUPPLIES) IMPLANT
BNDG PLSTR 9X3 FST ST WHT (CAST SUPPLIES)
CHLORAPREP W/TINT 26 (MISCELLANEOUS) ×2 IMPLANT
CORD BIPOLAR FORCEPS 12FT (ELECTRODE) ×2 IMPLANT
COVER BACK TABLE 60X90IN (DRAPES) ×2 IMPLANT
COVER MAYO STAND STRL (DRAPES) ×2 IMPLANT
CUFF TOURN SGL QUICK 18X4 (TOURNIQUET CUFF) ×1 IMPLANT
DRAPE EXTREMITY T 121X128X90 (DISPOSABLE) ×2 IMPLANT
DRAPE SURG 17X23 STRL (DRAPES) ×2 IMPLANT
GAUZE SPONGE 4X4 12PLY STRL (GAUZE/BANDAGES/DRESSINGS) ×2 IMPLANT
GAUZE XEROFORM 1X8 LF (GAUZE/BANDAGES/DRESSINGS) ×2 IMPLANT
GLOVE SRG 8 PF TXTR STRL LF DI (GLOVE) ×1 IMPLANT
GLOVE SURG ENC MOIS LTX SZ7.5 (GLOVE) ×3 IMPLANT
GLOVE SURG UNDER POLY LF SZ8 (GLOVE) ×6
GOWN STRL REUS W/ TWL LRG LVL3 (GOWN DISPOSABLE) ×1 IMPLANT
GOWN STRL REUS W/TWL LRG LVL3 (GOWN DISPOSABLE) ×2
GOWN STRL REUS W/TWL XL LVL3 (GOWN DISPOSABLE) ×3 IMPLANT
NDL HYPO 25X1 1.5 SAFETY (NEEDLE) ×1 IMPLANT
NEEDLE HYPO 25X1 1.5 SAFETY (NEEDLE) ×2 IMPLANT
NS IRRIG 1000ML POUR BTL (IV SOLUTION) ×2 IMPLANT
PACK BASIN DAY SURGERY FS (CUSTOM PROCEDURE TRAY) ×2 IMPLANT
PAD CAST 3X4 CTTN HI CHSV (CAST SUPPLIES) IMPLANT
PAD CAST 4YDX4 CTTN HI CHSV (CAST SUPPLIES) IMPLANT
PADDING CAST ABS 4INX4YD NS (CAST SUPPLIES) ×1
PADDING CAST ABS COTTON 4X4 ST (CAST SUPPLIES) ×1 IMPLANT
PADDING CAST COTTON 3X4 STRL (CAST SUPPLIES)
PADDING CAST COTTON 4X4 STRL (CAST SUPPLIES)
STOCKINETTE 4X48 STRL (DRAPES) ×2 IMPLANT
STRIP CLOSURE SKIN 1/2X4 (GAUZE/BANDAGES/DRESSINGS) IMPLANT
SUT ETHILON 3 0 PS 1 (SUTURE) IMPLANT
SUT ETHILON 4 0 PS 2 18 (SUTURE) ×2 IMPLANT
SUT ETHILON 5 0 P 3 18 (SUTURE)
SUT NYLON ETHILON 5-0 P-3 1X18 (SUTURE) IMPLANT
SUT VIC AB 4-0 P2 18 (SUTURE) IMPLANT
SUT VICRYL 3-0 RB1 18 ABS (SUTURE) ×1 IMPLANT
SYR BULB EAR ULCER 3OZ GRN STR (SYRINGE) ×2 IMPLANT
SYR CONTROL 10ML LL (SYRINGE) ×2 IMPLANT
TOWEL GREEN STERILE FF (TOWEL DISPOSABLE) ×4 IMPLANT
UNDERPAD 30X36 HEAVY ABSORB (UNDERPADS AND DIAPERS) ×2 IMPLANT

## 2021-06-06 NOTE — Discharge Instructions (Addendum)

## 2021-06-06 NOTE — Op Note (Signed)
I assisted Surgeon(s) and Role:    * Betha Loa, MD - Primary    Cindee Salt, MD - Assisting on the Procedure(s): EXCISION MASS RIGHT WRIST on 06/06/2021.  I provided assistance on this case as follows: setup, approach, identification, isolation and removal of mass ,closure of the wound and application of the dressing.  Electronically signed by: Cindee Salt, MD Date: 06/06/2021 Time: 3:04 PM

## 2021-06-06 NOTE — Anesthesia Procedure Notes (Signed)
Procedure Name: LMA Insertion Date/Time: 06/06/2021 2:38 PM Performed by: Pearson Grippe, CRNA Pre-anesthesia Checklist: Patient identified, Emergency Drugs available, Suction available and Patient being monitored Patient Re-evaluated:Patient Re-evaluated prior to induction Oxygen Delivery Method: Circle system utilized Preoxygenation: Pre-oxygenation with 100% oxygen Induction Type: IV induction Ventilation: Mask ventilation without difficulty LMA: LMA inserted LMA Size: 4.0 Number of attempts: 1 Airway Equipment and Method: Bite block Placement Confirmation: positive ETCO2 Tube secured with: Tape Dental Injury: Teeth and Oropharynx as per pre-operative assessment

## 2021-06-06 NOTE — Anesthesia Preprocedure Evaluation (Signed)
Anesthesia Evaluation  Patient identified by MRN, date of birth, ID band Patient awake    Reviewed: Allergy & Precautions, NPO status , Patient's Chart, lab work & pertinent test results  History of Anesthesia Complications Negative for: history of anesthetic complications  Airway Mallampati: II  TM Distance: >3 FB Neck ROM: Full  Mouth opening: Limited Mouth Opening  Dental  (+) Edentulous Upper, Poor Dentition   Pulmonary COPD, Current Smoker and Patient abstained from smoking.,    Pulmonary exam normal        Cardiovascular Normal cardiovascular exam  EKG 07/24/19: SB, rate 47, nonspecific T-changes   Neuro/Psych Depression negative neurological ROS     GI/Hepatic negative GI ROS, Neg liver ROS,   Endo/Other  Hypothyroidism (untreated (TSH 285))   Renal/GU negative Renal ROS  negative genitourinary   Musculoskeletal negative musculoskeletal ROS (+)   Abdominal   Peds  Hematology  (+) anemia , Hgb 9.7   Anesthesia Other Findings Day of surgery medications reviewed with patient.  Reproductive/Obstetrics negative OB ROS                             Anesthesia Physical  Anesthesia Plan  ASA: III  Anesthesia Plan: General   Post-op Pain Management:    Induction: Intravenous  PONV Risk Score and Plan: 3 and Treatment may vary due to age or medical condition, Ondansetron, Dexamethasone and Midazolam  Airway Management Planned: LMA  Additional Equipment: None  Intra-op Plan:   Post-operative Plan: Extubation in OR  Informed Consent:   Plan Discussed with:   Anesthesia Plan Comments:         Anesthesia Quick Evaluation
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
## 2021-06-06 NOTE — Op Note (Signed)
NAME: NYANA HAREN MEDICAL RECORD NO: [id number] DATE OF BIRTH: 10-10-1960 FACILITY: [facility] LOCATION: [facility] PHYSICIAN: Tami Ribas, MD   OPERATIVE REPORT   DATE OF PROCEDURE: 06/06/21    PREOPERATIVE DIAGNOSIS: Right wrist mass   POSTOPERATIVE DIAGNOSIS: Right wrist dorsal ganglion   PROCEDURE: Excision right wrist dorsal ganglion   SURGEON:  Betha Loa, M.D.   ASSISTANT: Cindee Salt, MD   ANESTHESIA:  General   INTRAVENOUS FLUIDS:  Per anesthesia flow sheet.   ESTIMATED BLOOD LOSS:  Minimal.   COMPLICATIONS:  None.   SPECIMENS: Right wrist mass to pathology   TOURNIQUET TIME:    Total Tourniquet Time Documented: Upper Arm (Right) - 22 minutes Total: Upper Arm (Right) - 22 minutes    DISPOSITION:  Stable to PACU.   INDICATIONS: 60 year old female is noted a mass on the dorsum of her right wrist.  It is bothersome to her.  She wishes to have it removed.  Risks, benefits and alternatives of surgery were discussed including the risks of blood loss, infection, damage to nerves, vessels, tendons, ligaments, bone for surgery, need for additional surgery, complications with wound healing, continued pain, stiffness, recurrence.  She voiced understanding of these risks and elected to proceed.  OPERATIVE COURSE:  After being identified preoperatively by myself,  the patient and I agreed on the procedure and site of the procedure.  The surgical site was marked.  Surgical consent had been signed. She was given IV antibiotics as preoperative antibiotic prophylaxis. She was transferred to the operating room and placed on the operating table in supine position with the Right upper extremity on an arm board.  General anesthesia was induced by the anesthesiologist.  Right upper extremity was prepped and draped in normal sterile orthopedic fashion.  A surgical pause was performed between the surgeons, anesthesia, and operating room staff and all were in agreement  as to the patient, procedure, and site of procedure.  Tourniquet at the proximal aspect of the extremity was inflated to 250 mmHg after exsanguination of the arm with an Esmarch bandage.  A longitudinal incision was made over the mass on the dorsal ulnar aspect of the wrist over the ulnar head.  This was carried into subcutaneous tissues by spreading technique.  Bipolar electrocautery was used to obtain hemostasis.  Care was taken to identify any cutaneous nerves.  The mass was easily identified.  It had multiple layers of soft tissue over top and it was carefully freed up from these.  Some of these layers were thickened and sharply incised.  The mass was filled with clear gelatinous fluid.  It appeared to be a ganglion cyst.  Came directly from the dorsum of the ulnar head.  The ulnar head was exposed underneath the ganglion.  There was a stalk coming from the DRUJ.  The stalk was removed.  The wound was copiously irrigated with sterile saline.  The cyst was sent to pathology for examination.  The capsule was repaired back over the dorsum of the DRUJ and distal ulna with a 3-0 Vicryl suture in a figure-of-eight fashion.  Skin was closed with 4-0 nylon in a horizontal mattress fashion.  The wound was injected with quarter percent plain Marcaine to aid in postoperative analgesia.  It was dressed with sterile Xeroform 4 x 4's and an ABD used as a splint.  This was wrapped with Kerlix and Ace bandage.  The tourniquet was deflated at 22 minutes.  Fingertips were pink with brisk capillary  refill after deflation of tourniquet.  The operative  drapes were broken down.  The patient was awoken from anesthesia safely.  She was transferred back to the stretcher and taken to PACU in stable condition.  I will see her back in the office in 1 week for postoperative followup.  I will give her a prescription for Norco 5/325 1-2 tabs PO q6 hours prn pain, dispense # 20.   Betha Loa, MD Electronically signed, 06/06/21

## 2021-06-06 NOTE — Transfer of Care (Signed)
Immediate Anesthesia Transfer of Care Note  Patient: [name]  Procedure(s) Performed: EXCISION MASS RIGHT WRIST (Right: Hand)  Patient Location: PACU  Anesthesia Type:General  Level of Consciousness: drowsy and patient cooperative  Airway & Oxygen Therapy: Patient Spontanous Breathing and Patient connected to face mask oxygen  Post-op Assessment: Report given to RN and Post -op Vital signs reviewed and stable  Post vital signs: Reviewed and stable  Last Vitals:  Vitals Value Taken Time  BP 98/66 06/06/21 1508  Temp    Pulse 62 06/06/21 1509  Resp 12 06/06/21 1509  SpO2 100 % 06/06/21 1509  Vitals shown include unvalidated device data.  Last Pain:  Vitals:   06/06/21 1221  TempSrc: Oral  PainSc: 8       Patients Stated Pain Goal: 3 (06/06/21 1221)  Complications: No notable events documented.

## 2021-06-06 NOTE — Anesthesia Postprocedure Evaluation (Signed)
Anesthesia Post Note  Patient: [name]  Procedure(s) Performed: EXCISION MASS RIGHT WRIST (Right: Hand)     Patient location during evaluation: PACU Anesthesia Type: General Level of consciousness: awake and alert Pain management: pain level controlled Vital Signs Assessment: post-procedure vital signs reviewed and stable Respiratory status: spontaneous breathing, nonlabored ventilation and respiratory function stable Cardiovascular status: blood pressure returned to baseline and stable Postop Assessment: no apparent nausea or vomiting Anesthetic complications: no   No notable events documented.  Last Vitals:  Vitals:   06/06/21 1607 06/06/21 1615  BP:  119/72  Pulse: (!) 59 66  Resp: 14 17  Temp:    SpO2: 100% 95%    Last Pain:  Vitals:   06/06/21 1615  TempSrc:   PainSc: 7                  Lowella Curb

## 2021-06-08 ENCOUNTER — Encounter (HOSPITAL_BASED_OUTPATIENT_CLINIC_OR_DEPARTMENT_OTHER): Payer: Self-pay | Admitting: Orthopedic Surgery

## 2021-06-08 LAB — SURGICAL PATHOLOGY

## 2021-08-31 ENCOUNTER — Telehealth: Payer: Medicare HMO | Admitting: Nurse Practitioner

## 2021-08-31 DIAGNOSIS — J4521 Mild intermittent asthma with (acute) exacerbation: Secondary | ICD-10-CM | POA: Diagnosis not present

## 2021-08-31 DIAGNOSIS — R52 Pain, unspecified: Secondary | ICD-10-CM

## 2021-08-31 MED ORDER — ALBUTEROL SULFATE HFA 108 (90 BASE) MCG/ACT IN AERS: 2.0000 | INHALATION_SPRAY | Freq: Four times a day (QID) | RESPIRATORY_TRACT | 0 refills | Status: DC | PRN

## 2021-08-31 MED ORDER — PREDNISONE 20 MG PO TABS: 20.0000 mg | ORAL_TABLET | Freq: Two times a day (BID) | ORAL | 0 refills | Status: AC

## 2021-08-31 NOTE — Progress Notes (Signed)
Visit for Asthma  Based on what you have shared with me, it looks like you may have a flare up of your asthma.  Asthma is a chronic (ongoing) lung disease which results in airway obstruction, inflammation and hyper-responsiveness.   Asthma symptoms vary from person to person, with common symptoms including nighttime awakening and decreased ability to participate in normal activities as a result of shortness of breath. It is often triggered by changes in weather, changes in the season, changes in air temperature, or inside (home, school, daycare or work) allergens such as animal dander, mold, mildew, woodstoves or cockroaches.   It can also be triggered by hormonal changes, extreme emotion, physical exertion or an upper respiratory tract illness.     It is important to identify the trigger, and then eliminate or avoid the trigger if possible.   If you have been prescribed medications to be taken on a regular basis, it is important to follow the asthma action plan and to follow guidelines to adjust medication in response to increasing symptoms of decreased peak expiratory flow rate  Treatment: I have prescribed: Albuterol (Proventil HFA; Ventolin HFA) 108 (90 Base) MCG/ACT Inhaler 2 puffs into the lungs every six hours as needed for wheezing or shortness of breath  For your acute exacerbation we have also prescribed a prednisone burst  Meds ordered this encounter  Medications   albuterol (VENTOLIN HFA) 108 (90 Base) MCG/ACT inhaler    Sig: Inhale 2 puffs into the lungs every 6 (six) hours as needed for wheezing or shortness of breath.    Dispense:  8 g    Refill:  0   predniSONE (DELTASONE) 20 MG tablet    Sig: Take 1 tablet (20 mg total) by mouth 2 (two) times daily with a meal for 5 days. Take with food    Dispense:  10 tablet    Refill:  0     HOME CARE Only take medications as  instructed by your medical team. Consider wearing a mask or scarf to improve breathing air temperature have been shown to decrease irritation and decrease exacerbations Get rest. Taking a steamy shower or using a humidifier may help nasal congestion sand ease sore throat pain. You can place a towel over your head and breathe in the steam from hot water coming from a faucet. Using a saline nasal spray works much the same way.  Cough drops, hare candies and sore throat lozenges may ease your cough.  Avoid close contacts especially the very you and the elderly Cover your mouth if you cough or sneeze Always remember to wash your hands.    GET HELP RIGHT AWAY IF: You develop worsening symptoms; breathlessness at rest, drowsy, confused or agitated, unable to speak in full sentences You have coughing fits You develop a severe headache or visual changes You develop shortness of breath, difficulty breathing or start having chest pain Your symptoms persist after you have completed your treatment plan If your symptoms do not improve within 10 days  MAKE SURE YOU Understand these instructions. Will watch your condition. Will get help right away if you are not doing well or get worse.   Your e-visit answers were reviewed by a board certified advanced clinical practitioner to complete your personal care plan, Depending upon the condition, your plan could have included both over the counter or prescription medications.   Please review your pharmacy choice. Your safety is important to [place]. If you have drug allergies check your prescription carefully.  You can use MyChart to ask questions about today's visit, request a non-urgent  call back, or ask for a work or school excuse for 24 hours related to this e-Visit. If it has been greater than 24 hours you will need to follow up with your provider, or enter a new e-Visit to address those concerns.   You will get an e-mail in the next two days asking about  your experience. I hope that your e-visit has been valuable and will speed your recovery. Thank you for using e-visits.   I spent approximately 7 minutes reviewing the patient's history, current symptoms and coordinating their plan of care today.

## 2021-08-31 NOTE — Progress Notes (Signed)
[name],  We are able to refill your asthma medications as was completed in your previous visit.   We cannot prescribe anything stronger for your back pain- for 10/10 pain you will need to be evaluated in person for pain management. I will include a list of local urgent care facilities below  Based on what you shared with me, I feel your condition warrants further evaluation and I recommend that you be seen in a face to face visit.   NOTE: There will be NO CHARGE for this eVisit   If you are having a true medical emergency please call 911.      For an urgent face to face visit, [place] has six urgent care centers for your convenience:     [facility] Center at [facility] Directions [phone] [address] [place], [place] [address]    [facility] Ur[facility] Care Center [facility]) Get Driving Directions [phone] [address] [place], [place] [address]  [facility] Center [facility]) Get Driving Directions [phone] [address] [place],  [place]  [address]  [facility] Health Urgent Care at [facility] Get Driving Directions [phone] [address] [place], [place] [address]   [facility] Health Urgent Care at [facility] Get Driving Directions  [phone] [address] [place], [place] [address]   [facility] Health Urgent Care at [facility] Directions [phone] [address] [place], [place] [address]  Your MyChart E-visit questionnaire answers were reviewed by a board certified advanced clinical practitioner to complete your personal care plan based on your specific symptoms.  Thank you for using e-Visits.

## 2021-10-12 ENCOUNTER — Telehealth: Payer: Medicare HMO | Admitting: Physician Assistant

## 2021-10-12 ENCOUNTER — Other Ambulatory Visit: Payer: Self-pay | Admitting: Nurse Practitioner

## 2021-10-12 DIAGNOSIS — R0602 Shortness of breath: Secondary | ICD-10-CM

## 2021-10-12 DIAGNOSIS — R197 Diarrhea, unspecified: Secondary | ICD-10-CM

## 2021-10-12 DIAGNOSIS — J4521 Mild intermittent asthma with (acute) exacerbation: Secondary | ICD-10-CM

## 2021-10-12 NOTE — Progress Notes (Signed)
Based on what you shared with me, I feel your condition warrants further evaluation and I recommend that you be seen in a face to face visit. Giving bloody diarrhea with abdominal pain, you need to be evaluated in person for a detailed exam and further evaluation that can be given via e-visit or video visit. Please be evaluated at local Urgent Care ASAP. Please do not delay care!   NOTE: There will be NO CHARGE for this eVisit   If you are having a true medical emergency please call 911.      For an urgent face to face visit, [place] has six urgent care centers for your convenience:     [facility] Health Urgent Care Center at [facility] Directions [phone] [address] [place], [place] [address]    [facility] Health Urgent Care Center [facility]) Get Driving Directions [phone] [address] [place], [place] [address]  [facility] Health Urgent Care Center [facility]) Get Driving Directions [phone] [address] [place],  [place]  [address]  [facility] Health Urgent Care at [facility] Get Driving Directions [phone] [address] [place], [place] [address]   [facility] Health Urgent Care at [facility] Get Driving Directions  [phone] [address] [place], [place] [address]   [facility] Health Urgent Care at [facility] Directions [phone] [address] [place], [place] [address]  Your MyChart E-visit questionnaire answers were reviewed by a board certified advanced clinical practitioner to complete your personal care plan based on your specific symptoms.  Thank you for using e-Visits.

## 2021-10-12 NOTE — Progress Notes (Signed)
Based on what you shared with me, I feel your condition warrants further evaluation and I recommend that you be seen in a face to face visit.  As mentioned before giving the bloody diarrhea and abdominal pain, and especially now with breathing, you need to be seen in person.    NOTE: There will be NO CHARGE for this eVisit   If you are having a true medical emergency please call 911.      For an urgent face to face visit, [place] has six urgent care centers for your convenience:     [place] [facility] [place] Get Driving Directions [id number] [place], [place] [address]    [place] [facility]) Get Driving Directions [id number] [place], [place] [address]  [place] [facility] [place] [facility] Get Driving Directions [id number] [address] [place],  [place]  [address]  [place] [facility] at [facility] Get Driving Directions [id number] [address] [place], [place], [place] [address]   [place] [facility] at [facility] Get Driving Directions  [id number] [address] Suite [place], [place] [address]   [place] [facility] at [place] Get Driving Directions [id number] [address], [place], [place] [address]  Your MyChart E-visit questionnaire answers were reviewed by a board certified advanced clinical practitioner to complete your personal care plan based on your specific symptoms.  Thank you for using e-Visits.

## 2021-10-19 ENCOUNTER — Telehealth: Payer: Medicare HMO | Admitting: Family Medicine

## 2021-10-19 DIAGNOSIS — R0602 Shortness of breath: Secondary | ICD-10-CM

## 2021-10-19 NOTE — Progress Notes (Signed)
[place]  ? ?Needs in person assessment, has been reporting symptoms that are similar in last week and was advised to follow up in person as well  ? ? ?

## 2021-11-12 IMAGING — XA DG FLUORO GUIDE NDL PLC/BX
3 series · 3 of 3 positions shown · non-contrast
Comparison: none

CLINICAL DATA: 59-year-old female with chronic right wrist pain

[Series 1: ortho standard · 1 of 1 slices shown (1 of 3)]
[im 1/1]
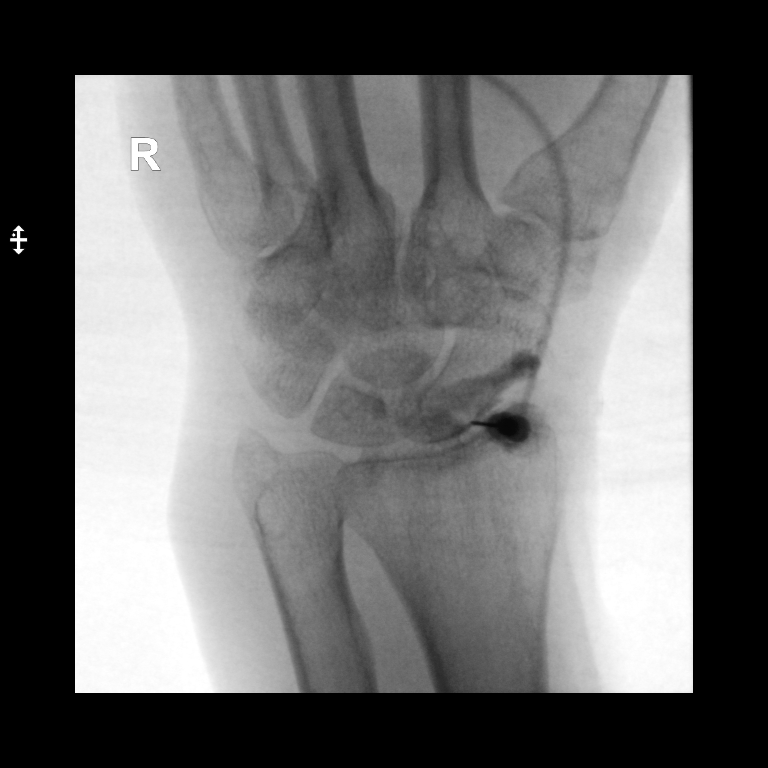

[Series 2: ortho standard · 1 of 1 slices shown (2 of 3)]
[im 1/1]
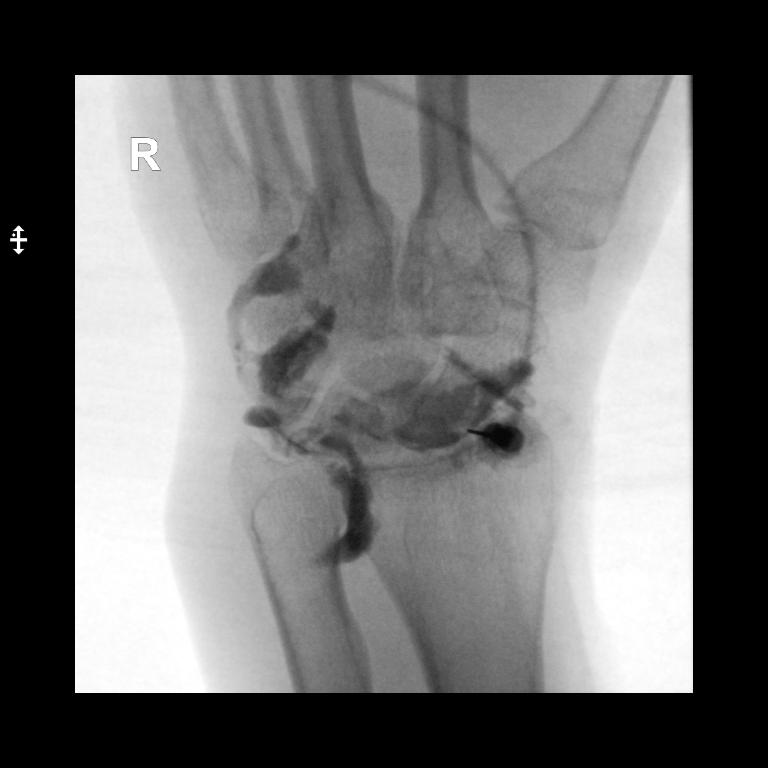

[Series 3: ortho standard · 1 of 1 slices shown (3 of 3)]
[im 1/1]
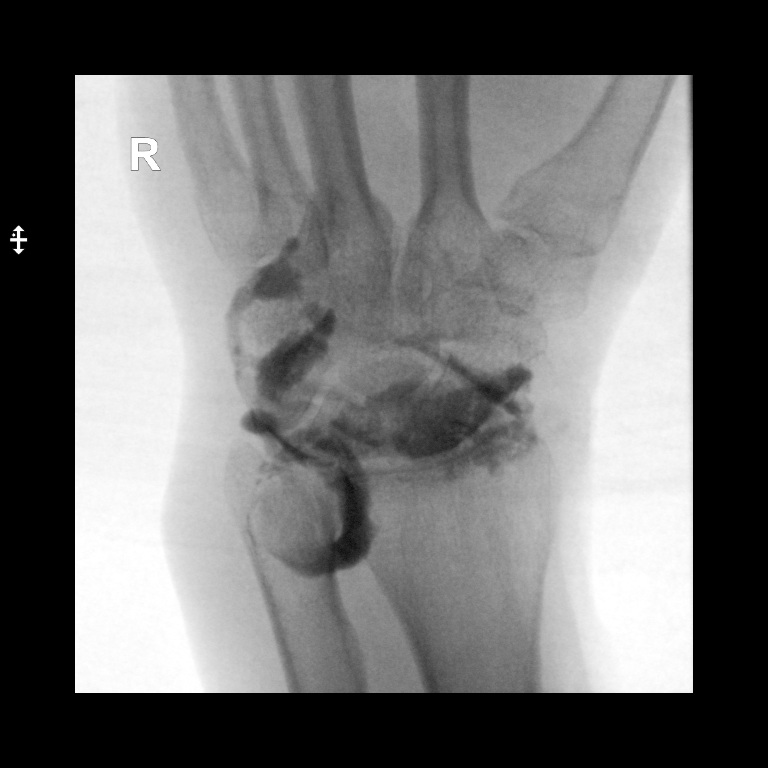

[3 of 3 positions shown; findings below may reference images not displayed]

FLUOROSCOPY TIME:  0 minutes 22 seconds 0.4 mGy

PROCEDURE:
RIGHT WRIST INJECTION FOR MRI

After a thorough discussion of risks and benefits of the procedure,
written and oral informed consent was obtained. The consent
discussion included the risk of bleeding, infection and injury to
nerves and adjacent blood vessels. Extra-articular injection was
also a possible risk discussed. Time out form Completed when
appropriate. We discussed the high likelihood of good diagnostic
study.

Preliminary localization was performed over the right wrist. The
area was marked over dorsal scaphoid.

After prep and drape in the usual sterile fashion, the skin and
deeper subcutaneous tissues were anesthetized with 1% Lidocaine
without Epinephrine. Under fluoroscopic guidance, a 23 gauge
hypodermic needle was advanced into the joint between distal radius
using a dorsal approach. The joint was distended with 3 ml of a
[DATE] dilution of Multihance contrast. After opacification of the
joint, injection was discontinued, the needle removed, and a sterile
dressing applied. The patient was taken to MRI for subsequent
imaging.

The patient tolerated the procedure well and there were no
complications.
IMPRESSION: Successful right wrist fluoroscopically guided injection.

## 2022-02-11 ENCOUNTER — Telehealth: Payer: Medicare HMO | Admitting: Family

## 2022-02-11 DIAGNOSIS — J4521 Mild intermittent asthma with (acute) exacerbation: Secondary | ICD-10-CM

## 2022-02-11 MED ORDER — ALBUTEROL SULFATE HFA 108 (90 BASE) MCG/ACT IN AERS: 2.0000 | INHALATION_SPRAY | Freq: Four times a day (QID) | RESPIRATORY_TRACT | 0 refills | Status: DC | PRN

## 2022-02-11 MED ORDER — PREDNISONE 20 MG PO TABS: 40.0000 mg | ORAL_TABLET | Freq: Every day | ORAL | 0 refills | Status: AC

## 2022-03-11 ENCOUNTER — Telehealth: Payer: Medicare HMO | Admitting: Nurse Practitioner

## 2022-03-11 ENCOUNTER — Telehealth: Payer: Self-pay

## 2022-03-11 DIAGNOSIS — J4521 Mild intermittent asthma with (acute) exacerbation: Secondary | ICD-10-CM

## 2022-03-11 MED ORDER — PREDNISONE 20 MG PO TABS: 40.0000 mg | ORAL_TABLET | Freq: Every day | ORAL | 0 refills | Status: AC

## 2022-03-11 MED ORDER — ALBUTEROL SULFATE HFA 108 (90 BASE) MCG/ACT IN AERS: 2.0000 | INHALATION_SPRAY | Freq: Four times a day (QID) | RESPIRATORY_TRACT | 0 refills | Status: DC | PRN

## 2022-03-11 NOTE — Progress Notes (Signed)
Visit for Asthma  Based on what you have shared with me, it looks like you may have a flare up of your asthma.  Asthma is a chronic (ongoing) lung disease which results in airway obstruction, inflammation and hyper-responsiveness.   Asthma symptoms vary from person to person, with common symptoms including nighttime awakening and decreased ability to participate in normal activities as a result of shortness of breath. It is often triggered by changes in weather, changes in the season, changes in air temperature, or inside (home, school, daycare or work) allergens such as animal dander, mold, mildew, woodstoves or cockroaches.   It can also be triggered by hormonal changes, extreme emotion, physical exertion or an upper respiratory tract illness.     It is important to identify the trigger, and then eliminate or avoid the trigger if possible.   If you have been prescribed medications to be taken on a regular basis, it is important to follow the asthma action plan and to follow guidelines to adjust medication in response to increasing symptoms of decreased peak expiratory flow rate  I am oing to treat you today but you have been treated for this every month th loast several months. It is very importat that you get a PCP for proper treatment. You need to be on maintenance meds to prevnet these flare ups.  Treatment: I have prescribed: Albuterol (Proventil HFA; Ventolin HFA) 108 (90 Base) MCG/ACT Inhaler 2 puffs into the lungs every six hours as needed for wheezing or shortness of breath and Prednisone 40mg  by mouth per day for 5 - 7 days  HOME CARE Only take medications as instructed by your medical team. Consider wearing a mask or scarf to improve breathing air temperature have been shown to decrease irritation and decrease exacerbations Get rest. Taking a steamy shower or using a  humidifier may help nasal congestion sand ease sore throat pain. You can place a towel over your head and breathe in the steam from hot water coming from a faucet. Using a saline nasal spray works much the same way.  Cough drops, hare candies and sore throat lozenges may ease your cough.  Avoid close contacts especially the very you and the elderly Cover your mouth if you cough or sneeze Always remember to wash your hands.    GET HELP RIGHT AWAY IF: You develop worsening symptoms; breathlessness at rest, drowsy, confused or agitated, unable to speak in full sentences You have coughing fits You develop a severe headache or visual changes You develop shortness of breath, difficulty breathing or start having chest pain Your symptoms persist after you have completed your treatment plan If your symptoms do not improve within 10 days  MAKE SURE YOU Understand these instructions. Will watch your condition. Will get help right away if you are not doing well or get worse.   Your e-visit answers were reviewed by a board certified advanced clinical practitioner to complete your personal care plan, Depending upon the condition, your plan could have included both over the counter or prescription medications.   Please review your pharmacy choice. Your safety is important to . If you have drug allergies check your prescription carefully.  You can use MyChart to ask questions about today's visit, request a non-urgent  call back, or ask for a work or school excuse for 24 hours related to this e-Visit. If it has been greater than 24 hours you will need to follow up with your provider, or enter a new e-Visit to address  those concerns.   You will get an e-mail in the next two days asking about your experience. I hope that your e-visit has been valuable and will speed your recovery. Thank you for using e-visits.  5-10 minutes spent reviewing and documenting in chart.

## 2022-03-11 NOTE — Telephone Encounter (Signed)
Called patient regarding concern re: multiple Virtual Urgent Care visits for asthma flare ups and frequent steroid use but patient was unavailable to speak to her.  Provider Bennie Pierini informed me that the patient will need to establish care with a primary care provider to get maintenance care in order to prevent additional flare-ups.  Patient [place] in the [place] area and the [place] office is in that area- will call patient again later today to discuss.  The [facility] office will likely be able to get her in this week- she should be instructed to call the office at [phone] for an appointment.   Susette Racer, RN 03/11/2022 12:01 PM

## 2022-03-12 ENCOUNTER — Telehealth: Payer: Self-pay

## 2022-03-12 NOTE — Progress Notes (Signed)
Letter sent to patient requesting she establish care with Primary Care.  She has had multiple visits with the Virtual Urgent Care department and needs to get maintenance care to prevent flare ups.   Susette Racer, RN 03/12/2022 9:51 AM

## 2022-03-12 NOTE — Telephone Encounter (Signed)
Called patient to discuss the number of visits the patient has made to the Virtual Urgent Care department for asthma exacerbation. Unable to reach the patient by phone- I have left a message to have her return my call as soon as she is able. We need to get the patient into a primary care practice to manage her asthma.   Susette Racer, RN 03/12/2022 8:54 AM

## 2022-06-30 ENCOUNTER — Other Ambulatory Visit: Payer: Self-pay

## 2022-06-30 ENCOUNTER — Emergency Department (HOSPITAL_BASED_OUTPATIENT_CLINIC_OR_DEPARTMENT_OTHER)
Admission: EM | Admit: 2022-06-30 | Discharge: 2022-07-01 | Disposition: A | Discharge status: Home / Self Care | Payer: Medicare HMO | Attending: Emergency Medicine | Admitting: Emergency Medicine

## 2022-06-30 ENCOUNTER — Emergency Department (HOSPITAL_BASED_OUTPATIENT_CLINIC_OR_DEPARTMENT_OTHER): Payer: Medicare HMO | Admitting: Radiology

## 2022-06-30 DIAGNOSIS — Z79899 Other long term (current) drug therapy: Secondary | ICD-10-CM | POA: Insufficient documentation

## 2022-06-30 DIAGNOSIS — Y9389 Activity, other specified: Secondary | ICD-10-CM | POA: Insufficient documentation

## 2022-06-30 DIAGNOSIS — J449 Chronic obstructive pulmonary disease, unspecified: Secondary | ICD-10-CM | POA: Insufficient documentation

## 2022-06-30 DIAGNOSIS — E039 Hypothyroidism, unspecified: Secondary | ICD-10-CM | POA: Insufficient documentation

## 2022-06-30 DIAGNOSIS — S6991XA Unspecified injury of right wrist, hand and finger(s), initial encounter: Secondary | ICD-10-CM | POA: Diagnosis present

## 2022-06-30 DIAGNOSIS — Z96642 Presence of left artificial hip joint: Secondary | ICD-10-CM | POA: Insufficient documentation

## 2022-06-30 DIAGNOSIS — S52501A Unspecified fracture of the lower end of right radius, initial encounter for closed fracture: Secondary | ICD-10-CM | POA: Insufficient documentation

## 2022-06-30 MED ORDER — HYDROMORPHONE HCL 1 MG/ML IJ SOLN
1.0000 mg | Freq: Once | INTRAMUSCULAR | Status: AC
  Administered 2022-06-30: 1 mg via INTRAMUSCULAR
  Filled 2022-06-30: qty 1

## 2022-06-30 MED ORDER — OXYCODONE-ACETAMINOPHEN 5-325 MG PO TABS
1.0000 | ORAL_TABLET | ORAL | Status: DC | PRN
  Administered 2022-06-30: 1 via ORAL
  Filled 2022-06-30: qty 1

## 2022-06-30 NOTE — ED Triage Notes (Signed)
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 She fell backwards on buttocks and attempted to catch self with right. Pt c/o right hand pain with obvious swelling. Pulses intact. 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 Pt tearful in triage. No LOC/head injury.
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
## 2022-06-30 NOTE — ED Notes (Signed)
Pt transported to XR.  

## 2022-07-01 DIAGNOSIS — S52501A Unspecified fracture of the lower end of right radius, initial encounter for closed fracture: Secondary | ICD-10-CM | POA: Diagnosis not present

## 2022-07-01 MED ORDER — ACETAMINOPHEN 325 MG PO TABS: 650.0000 mg | ORAL_TABLET | Freq: Four times a day (QID) | ORAL | 0 refills | Status: AC | PRN

## 2022-07-01 MED ORDER — HYDROCODONE-ACETAMINOPHEN 5-325 MG PO TABS
1.0000 | ORAL_TABLET | Freq: Once | ORAL | Status: AC
  Administered 2022-07-01: 1 via ORAL
  Filled 2022-07-01: qty 1

## 2022-07-01 MED ORDER — OXYCODONE HCL 5 MG PO TABS: 5.0000 mg | ORAL_TABLET | ORAL | 0 refills | Status: AC | PRN

## 2022-07-01 NOTE — Discharge Instructions (Addendum)
It was a pleasure caring for you today in the emergency department. ° °Please return to the emergency department for any worsening or worrisome symptoms. ° ° °

## 2022-07-01 NOTE — ED Notes (Signed)
Discharge instructions discussed with pt. Pt verbalized understanding with no questions at this time. Pt to go home with daughter at bedside

## 2022-07-02 ENCOUNTER — Telehealth: Payer: Medicare HMO | Admitting: Family

## 2022-07-02 DIAGNOSIS — J4520 Mild intermittent asthma, uncomplicated: Secondary | ICD-10-CM | POA: Diagnosis not present

## 2022-07-02 MED ORDER — ALBUTEROL SULFATE HFA 108 (90 BASE) MCG/ACT IN AERS: 2.0000 | INHALATION_SPRAY | Freq: Four times a day (QID) | RESPIRATORY_TRACT | 0 refills | Status: DC | PRN

## 2022-07-02 NOTE — Progress Notes (Signed)
Visit for Asthma  Based on what you have shared with me, it looks like you may have a flare up of your asthma.  Asthma is a chronic (ongoing) lung disease which results in airway obstruction, inflammation and hyper-responsiveness.   Asthma symptoms vary from person to person, with common symptoms including nighttime awakening and decreased ability to participate in normal activities as a result of shortness of breath. It is often triggered by changes in weather, changes in the season, changes in air temperature, or inside (home, school, daycare or work) allergens such as animal dander, mold, mildew, woodstoves or cockroaches.   It can also be triggered by hormonal changes, extreme emotion, physical exertion or an upper respiratory tract illness.     It is important to identify the trigger, and then eliminate or avoid the trigger if possible.   If you have been prescribed medications to be taken on a regular basis, it is important to follow the asthma action plan and to follow guidelines to adjust medication in response to increasing symptoms of decreased peak expiratory flow rate  Treatment: I have prescribed: Albuterol (Proventil HFA; Ventolin HFA) 108 (90 Base) MCG/ACT Inhaler 2 puffs into the lungs every six hours as needed for wheezing or shortness of breath.  You need to follow up and establish with a PCP. We can not continue to refill your albuterol inhaler long term.   HOME CARE Only take medications as instructed by your medical team. Consider wearing a mask or scarf to improve breathing air temperature have been shown to decrease irritation and decrease exacerbations Get rest. Taking a steamy shower or using a humidifier may help nasal congestion sand ease sore throat pain. You can place a towel over your head and breathe in the steam from hot water coming from a faucet. Using  a saline nasal spray works much the same way.  Cough drops, hare candies and sore throat lozenges may ease your cough.  Avoid close contacts especially the very you and the elderly Cover your mouth if you cough or sneeze Always remember to wash your hands.    GET HELP RIGHT AWAY IF: You develop worsening symptoms; breathlessness at rest, drowsy, confused or agitated, unable to speak in full sentences You have coughing fits You develop a severe headache or visual changes You develop shortness of breath, difficulty breathing or start having chest pain Your symptoms persist after you have completed your treatment plan If your symptoms do not improve within 10 days  MAKE SURE YOU Understand these instructions. Will watch your condition. Will get help right away if you are not doing well or get worse.   Your e-visit answers were reviewed by a board certified advanced clinical practitioner to complete your personal care plan, Depending upon the condition, your plan could have included both over the counter or prescription medications.   Please review your pharmacy choice. Your safety is important to [place]. If you have drug allergies check your prescription carefully.  You can use MyChart to ask questions about today's visit, request a non-urgent  call back, or ask for a work or school excuse for 24 hours related to this e-Visit. If it has been greater than 24 hours you will need to follow up with your provider, or enter a new e-Visit to address those concerns.   You will get an e-mail in the next two days asking about your experience. I hope that your e-visit has been valuable and will speed your recovery. Thank you for  using e-visits.  Approximately 5 minutes was spent documenting and reviewing patient's chart.

## 2022-09-25 ENCOUNTER — Telehealth: Payer: Medicare HMO | Admitting: Physician Assistant

## 2022-09-25 DIAGNOSIS — J4521 Mild intermittent asthma with (acute) exacerbation: Secondary | ICD-10-CM | POA: Diagnosis not present

## 2022-09-25 MED ORDER — PREDNISONE 20 MG PO TABS: 40.0000 mg | ORAL_TABLET | Freq: Every day | ORAL | 0 refills | Status: DC

## 2022-09-25 MED ORDER — ALBUTEROL SULFATE HFA 108 (90 BASE) MCG/ACT IN AERS: 2.0000 | INHALATION_SPRAY | Freq: Four times a day (QID) | RESPIRATORY_TRACT | 0 refills | Status: AC | PRN

## 2022-09-25 NOTE — Progress Notes (Signed)
Visit for Asthma  Based on what you have shared with me, it looks like you may have a flare up of your asthma.  Asthma is a chronic (ongoing) lung disease which results in airway obstruction, inflammation and hyper-responsiveness.   Asthma symptoms vary from person to person, with common symptoms including nighttime awakening and decreased ability to participate in normal activities as a result of shortness of breath. It is often triggered by changes in weather, changes in the season, changes in air temperature, or inside (home, school, daycare or work) allergens such as animal dander, mold, mildew, woodstoves or cockroaches.   It can also be triggered by hormonal changes, extreme emotion, physical exertion or an upper respiratory tract illness.     It is important to identify the trigger, and then eliminate or avoid the trigger if possible.   If you have been prescribed medications to be taken on a regular basis, it is important to follow the asthma action plan and to follow guidelines to adjust medication in response to increasing symptoms of decreased peak expiratory flow rate  Treatment: I have prescribed: Albuterol (Proventil HFA; Ventolin HFA) 108 (90 Base) MCG/ACT Inhaler 2 puffs into the lungs every six hours as needed for wheezing or shortness of breath and Prednisone 40mg  by mouth per day for 5 days  HOME CARE Only take medications as instructed by your medical team. Consider wearing a mask or scarf to improve breathing air temperature have been shown to decrease irritation and decrease exacerbations Get rest. Taking a steamy shower or using a humidifier may help nasal congestion sand ease sore throat pain. You can place a towel over your head and breathe in the steam from hot water coming from a faucet. Using a saline nasal spray works much the same way.  Cough drops, hare  candies and sore throat lozenges may ease your cough.  Avoid close contacts especially the very you and the elderly Cover your mouth if you cough or sneeze Always remember to wash your hands.    GET HELP RIGHT AWAY IF: You develop worsening symptoms; breathlessness at rest, drowsy, confused or agitated, unable to speak in full sentences You have coughing fits You develop a severe headache or visual changes You develop shortness of breath, difficulty breathing or start having chest pain Your symptoms persist after you have completed your treatment plan If your symptoms do not improve within 10 days  MAKE SURE YOU Understand these instructions. Will watch your condition. Will get help right away if you are not doing well or get worse.   Your e-visit answers were reviewed by a board certified advanced clinical practitioner to complete your personal care plan, Depending upon the condition, your plan could have included both over the counter or prescription medications.   Please review your pharmacy choice. Your safety is important to [place]. If you have drug allergies check your prescription carefully.  You can use MyChart to ask questions about today's visit, request a non-urgent  call back, or ask for a work or school excuse for 24 hours related to this e-Visit. If it has been greater than 24 hours you will need to follow up with your provider, or enter a new e-Visit to address those concerns.   You will get an e-mail in the next two days asking about your experience. I hope that your e-visit has been valuable and will speed your recovery. Thank you for using e-visits.  I have spent 5 minutes in review of e-visit questionnaire,  review and updating patient chart, medical decision making and response to patient.   Mar Daring, PA-C

## 2022-11-13 ENCOUNTER — Ambulatory Visit (HOSPITAL_COMMUNITY)
Admission: EM | Admit: 2022-11-13 | Discharge: 2022-11-13 | Disposition: A | Discharge status: Home / Self Care | Payer: Medicare HMO | Attending: Psychiatry | Admitting: Psychiatry

## 2022-11-13 DIAGNOSIS — F32A Depression, unspecified: Secondary | ICD-10-CM | POA: Diagnosis not present

## 2022-11-13 DIAGNOSIS — Z789 Other specified health status: Secondary | ICD-10-CM | POA: Diagnosis not present

## 2022-11-13 DIAGNOSIS — F419 Anxiety disorder, unspecified: Secondary | ICD-10-CM | POA: Diagnosis present

## 2022-11-13 NOTE — Discharge Instructions (Signed)
Based on what you have shared, a list of resources for outpatient therapy and psychiatry is provided below to get you started back on treatment.  It is imperative that you follow through with treatment within 5-7 days from the day of discharge to prevent any further risk to your safety or mental well-being.  You are not limited to the list provided.  In case of an urgent crisis, you may contact the Mobile Crisis Unit with [facility] at [phone].        Outpatient Services for Therapy and Medication Management for Medicaid  [facility] [address] [place], [place], [address] [phone] phone  New Patient Assessment/Therapy Walk-ins Monday and Wednesday: 8am until slots are full. Every 1st and 2nd Friday: 1pm - 5pm  NO ASSESSMENT/THERAPY WALK-INS ON TUESDAYS OR THURSDAYS  New Patient Psychiatry/Medication Management Walk-ins Monday-Friday: 8am-11am  For all walk-ins, we ask that you arrive by 7:30am because patient will be seen in the order of arrival.  Availability is limited; therefore, you may not be seen on the same day that you walk-in.  Our goal is to serve and meet the needs of our community to the best of our ability.   [facility] [address] [place], [place], [address] [phone] phone  [facility] [address] [place], [place], [address] [phone] phone [phone] phone (Aetna, AmeriHealth, Anthem/Elevance, BCBS, Centivo, Cigna/Evernorth, ComPsych, Healthy Blue, Medicaid, Optum, [facility], The Alliance, UMR, UHC, Vaya Health, WellCare, Out of Network)  [facility] [address] [place], [place], [address] [phone] phone (Aetna, Anthem/Elevance, [facility] Health Options/Carelon, BCBS, [facility], Humana, Magellan, MedCost, Medicaid, Medicare, MultiPlan, Tricare, UMR, UHC)  [facility] [address] [place], [place], [address] [phone] phone (Medicaid, ask about other insurance)  [facility] [address] [place], [place], [address] [phone] phone [phone] fax (Aetna, BCBS , Cigna, Medicaid, [place] Health Choice, UHC, TRICARE, Self-Pay)  [facility] [address] [place], [place], [address] [phone] phone (Aetna, Anthem/Elevance, BCBS, [facility], Cigna/Evernorth, Health Choice, Humana, MedCost, Medicaid, Medicare, Optum, Tricare, UMR, UHC)  [facility] - 6-8 MONTH WAIT FOR THERAPY; [facility] FOR MEDICATION MANAGEMENT [address] [place], [place], [address] [phone] phone (Aetna, AmeriHealth [facility] [place], BCBS, Cigna, Evernorth, Friday Health Plans, [facility], BCBS Healthy Blue, Humana, Magellan Health, Medcost, Medicare, Medicaid, Optum, Tricare, UHC, UHC Community Plan, Wellcare)  [facility] [address] [place], [place], [address] [phone] phone  [facility] [address] [place], [place], [address] [phone] phone  [facility] [address] [place], [place], [address] [phone] phone  [facility] - THERAPY ONLY [address] [place], [place], [address] [phone] phone  [facility] [address] [place], [place], [address] [phone] phone  [facility] [address] [place], [place], [address] [phone] phone [phone] fax  [facility] [address] [place], [place], [address] [phone] phone [phone] fax  [facility] [address] [place], [place], [address] [phone] phone  [facility] [address] [place], [place], [address]  [phone] phone  [facility]  (Adults Only) [address] , ,  [address]  [phone] phone [phone] fax  

## 2022-11-13 NOTE — Progress Notes (Signed)
   11/13/22 0914  [place] Triage Screening (Walk-ins at [facility] only)  How Did You Hear About [place]? Self  What Is the Reason for Your Visit/Call Today? ROUTINE: [name] is a 62 y/o female presenting to the [facility]. Patient is voluntary. According to patient she is under a lot of stress, triggered by issues with her son. States that her son has a mental health issues. She recently had him IVC'd, he was hospitalized at [facility], and he has since returned home. Since returning home he refuses to take his psychiatric medications. States that he is living in her home and hallucinates throughout the day. Although, patient hallucinations patient has no safety concerns with him living in her home. States, "Everyone tells me to put him out but I can't turn my back on my son". Patient denies history of SI and self injurious behaviors. Denies HI and AVH's. 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 States that she was previously IVC'd by the son noted above according to her under false allegations. No therapist or psychiatrist. Patient is tearful. Overall, she is requesting advice about what to do with her son who is non compliant with psychiatric treatment.  How Long Has This Been Causing You Problems? 1-6 months  Have You Recently Had Any Thoughts About Hurting Yourself? No  Are You Planning to Commit Suicide/Harm Yourself At This time? No  Have you Recently Had Thoughts About [place]? No  Are You Planning To Harm Someone At This Time? No  Are you currently experiencing any auditory, visual or other hallucinations? No  Have You Used Any Alcohol or Drugs in the Past 24 Hours? No  Do you have any current medical co-morbidities that require immediate attention? No  Clinician description of patient physical appearance/behavior: Tearful  What Do You Feel Would Help You the Most Today? Treatment for Depression or other mood problem  If access to [facility] was not available, would  you have sought care in the Emergency Department? No  Determination of Need Routine (7 days)  Options For Referral Outpatient Therapy

## 2022-11-13 NOTE — ED Provider Notes (Signed)
Behavioral Health Urgent Care Medical Screening Exam  Patient Name: [name] MRN: [id number] Date of Evaluation: 11/13/22 Chief Complaint:  "my son needs help" Diagnosis:  Final diagnoses:  Need for community resource   History of Present illness: [name] is a 62 y.o. female patient presented to [facility] as a walk in alone with complaints of, "my son needs help".  [name], 56 y.o., female patient seen face to face by this provider and chart reviewed on 11/13/22.  On evaluation [name] reports she is worried about her son and did not know where else to go.  She was referred here by family members.  States her son has been diagnosed with schizophrenia and was recently hospitalized at [facility] after she petition an IVC.  States after he got out of [facility] he stopped taking medications and is now hallucinating.  States he talks to himself and is difficult for her to have any peace in her home.  She does not want to put patient help because she does not want anything bad to happen him.  She denies that he is any type of safety concern.  States he does not threaten her and he has not threatened others.  She is requesting advice on how to convince her son to be compliant with psychiatric treatment.  Provided psychiatric resources for patient for herself for medication management and therapy services.  In addition provided resources for ACTT team and social work provided resources on how to apply for disability for those who have been diagnosed with a mental illness.  Patient was extremely appreciative.   During evaluation [name] is observed sitting in the assessment room in no acute distress.  She is alert/oriented x 4, cooperative, and attentive.  She endorses an increase in her depression and anxiety related to her son's mental illness.  She has a depressed affect and is tearful when she is talking about her son.  She denies any concerns with appetite or sleep.  She  denies SI/HI/AVH. Objectively there is no evidence of psychosis/mania or delusional thinking.  Patient is able to converse coherently, goal directed thoughts, no distractibility, or pre-occupation.  Patient answered question appropriately.    [place] ED from 11/13/2022 in [facility] ED from 06/30/2022 in [facility] Emergency Department at [facility] Admission (Discharged) from 06/06/2021 in [facility] No Risk No Risk No Risk       Psychiatric Specialty Exam  Presentation  General Appearance:Casual; Appropriate for Environment  Eye Contact:Good  Speech:Clear and Coherent; Normal Rate  Speech Volume:No data recorded Handedness:Right   Mood and Affect  Mood: Anxious; Depressed  Affect: Tearful; Congruent   Thought Process  Thought Processes: Coherent  Descriptions of Associations:Intact  Orientation:Full (Time, Place and Person)  Thought Content:Logical    Hallucinations:None  Ideas of Reference:None  Suicidal Thoughts:No  Homicidal Thoughts:No   Sensorium  Memory: Immediate Good; Recent Good; Remote Good  Judgment: Good  Insight: Good   Executive Functions  Concentration: Good  Attention Span: Good  Recall: Good  Fund of Knowledge: Good  Language: Good   Psychomotor Activity  Psychomotor Activity: Normal   Assets  Assets: Communication Skills; Desire for Improvement; Financial Resources/Insurance; Physical Health; Resilience; Social Support; Leisure Time   Sleep  Sleep: Fair  Number of hours: No data recorded  Physical Exam: Physical Exam Vitals and nursing note reviewed.  Constitutional:      General: She  is not in acute distress.    Appearance: Normal appearance. She is not ill-appearing.  HENT:     Head: Normocephalic.  Eyes:     General:        Right eye: No discharge.        Left eye: No discharge.  Cardiovascular:     Rate and Rhythm: Normal  rate.  Pulmonary:     Effort: Pulmonary effort is normal.  Musculoskeletal:        General: Normal range of motion.     Cervical back: Normal range of motion.  Skin:    Coloration: Skin is not jaundiced or pale.  Neurological:     Mental Status: She is alert and oriented to person, place, and time.  Psychiatric:        Mood and Affect: Affect normal.    Review of Systems  Constitutional: Negative.   HENT: Negative.    Eyes: Negative.   Respiratory: Negative.    Cardiovascular: Negative.   Musculoskeletal: Negative.   Skin: Negative.   Neurological: Negative.   Psychiatric/Behavioral:  Positive for depression. The patient is nervous/anxious.    Blood pressure (!) 178/98, pulse 99, temperature 98 F (36.7 C), temperature source Oral, resp. rate 18, SpO2 98 %. There is no height or weight on file to calculate BMI.  Musculoskeletal: Strength & Muscle Tone: within normal limits Gait & Station: normal Patient leans: N/A   [facility] MSE Discharge Disposition for Follow up and Recommendations: Based on my evaluation the patient does not appear to have an emergency medical condition and can be discharged with resources and follow up care in outpatient services for community resources    Discharge patient   Provided out patient resources for medication management and therapy.   Provided outpatient resources for community services such as a ACTT services.   Julie [place], NP 11/13/2022, 11:19 AM

## 2022-11-13 NOTE — ED Notes (Signed)
Patient discharged by provider Thomes Lolling, NP  with written and verbal instructions. Resources given.

## 2023-04-04 ENCOUNTER — Telehealth: Payer: Medicare HMO | Admitting: Physician Assistant

## 2023-04-04 DIAGNOSIS — G8929 Other chronic pain: Secondary | ICD-10-CM | POA: Diagnosis not present

## 2023-04-04 DIAGNOSIS — M5442 Lumbago with sciatica, left side: Secondary | ICD-10-CM | POA: Diagnosis not present

## 2023-04-04 MED ORDER — METHYLPREDNISOLONE 4 MG PO TBPK: ORAL_TABLET | ORAL | 0 refills | Status: AC

## 2023-04-04 NOTE — Progress Notes (Signed)
We are sorry that you are not feeling well.  Here is how we plan to help!  Based on what you have shared with me it looks like you mostly have acute back pain.  Acute back pain is defined as musculoskeletal pain that can resolve in 1-3 weeks with conservative treatment.  I have prescribed Medrol dose pack, a steroid anti-inflammatory. Some patients experience stomach irritation or in increased heartburn with anti-inflammatory drugs. Back pain is very common.  The pain often gets better over time.  The cause of back pain is usually not dangerous.  Most people can learn to manage their back pain on their own.  Home Care Stay active.  Start with short walks on flat ground if you can.  Try to walk farther each day. Do not sit, drive or stand in one place for more than 30 minutes.  Do not stay in bed. Do not avoid exercise or work.  Activity can help your back heal faster. Be careful when you bend or lift an object.  Bend at your knees, keep the object close to you, and do not twist. Sleep on a firm mattress.  Lie on your side, and bend your knees.  If you lie on your back, put a pillow under your knees. Only take medicines as told by your doctor. Put ice on the injured area. Put ice in a plastic bag Place a towel between your skin and the bag Leave the ice on for 15-20 minutes, 3-4 times a day for the first 2-3 days. 210 After that, you can switch between ice and heat packs. Ask your doctor about back exercises or massage. Avoid feeling anxious or stressed.  Find good ways to deal with stress, such as exercise.  Get Help Right Way If: Your pain does not go away with rest or medicine. Your pain does not go away in 1 week. You have new problems. You do not feel well. The pain spreads into your legs. You cannot control when you poop (bowel movement) or pee (urinate) You feel sick to your stomach (nauseous) or throw up (vomit) You have belly (abdominal) pain. You feel like you may pass out  (faint). If you develop a fever.  Make Sure you: Understand these instructions. Will watch your condition Will get help right away if you are not doing well or get worse.  Your e-visit answers were reviewed by a board certified advanced clinical practitioner to complete your personal care plan.  Depending on the condition, your plan could have included both over the counter or prescription medications.  If there is a problem please reply  once you have received a response from your provider.  Your safety is important to [place].  If you have drug allergies check your prescription carefully.    You can use MyChart to ask questions about today's visit, request a non-urgent call back, or ask for a work or school excuse for 24 hours related to this e-Visit. If it has been greater than 24 hours you will need to follow up with your provider, or enter a new e-Visit to address those concerns.  You will get an e-mail in the next two days asking about your experience.  I hope that your e-visit has been valuable and will speed your recovery. Thank you for using e-visits.   I have spent 5 minutes in review of e-visit questionnaire, review and updating patient chart, medical decision making and response to patient.   Margaretann Loveless, PA-C

## 2024-03-06 ENCOUNTER — Encounter: Payer: Self-pay | Admitting: Advanced Practice Midwife
